# Patient Record
Sex: Female | Born: 1985
Health system: Southern US, Community
[De-identification: ages and names within clinical notes are randomized; demographics above are authoritative.]

## PROBLEM LIST (undated history)

## (undated) DIAGNOSIS — F32A Depression, unspecified: Secondary | ICD-10-CM

## (undated) DIAGNOSIS — F329 Major depressive disorder, single episode, unspecified: Secondary | ICD-10-CM

## (undated) DIAGNOSIS — K259 Gastric ulcer, unspecified as acute or chronic, without hemorrhage or perforation: Secondary | ICD-10-CM

## (undated) HISTORY — PX: APPENDECTOMY: SHX54

---

## 2009-01-04 ENCOUNTER — Emergency Department (HOSPITAL_COMMUNITY): Admission: EM | Admit: 2009-01-04 | Discharge: 2009-01-04 | Payer: Self-pay | Admitting: Emergency Medicine

## 2009-02-06 ENCOUNTER — Emergency Department (HOSPITAL_COMMUNITY): Admission: EM | Admit: 2009-02-06 | Discharge: 2009-02-06 | Payer: Self-pay | Admitting: Emergency Medicine

## 2011-07-01 ENCOUNTER — Emergency Department (HOSPITAL_COMMUNITY)
Admission: EM | Admit: 2011-07-01 | Discharge: 2011-07-01 | Disposition: A | Discharge status: Home / Self Care | Payer: Self-pay | Source: Home / Self Care | Attending: Family Medicine | Admitting: Family Medicine

## 2011-07-02 DIAGNOSIS — N83209 Unspecified ovarian cyst, unspecified side: Secondary | ICD-10-CM | POA: Insufficient documentation

## 2011-07-02 DIAGNOSIS — Z9189 Other specified personal risk factors, not elsewhere classified: Secondary | ICD-10-CM | POA: Insufficient documentation

## 2011-10-31 ENCOUNTER — Emergency Department (HOSPITAL_BASED_OUTPATIENT_CLINIC_OR_DEPARTMENT_OTHER): Payer: Worker's Compensation

## 2011-10-31 ENCOUNTER — Emergency Department (HOSPITAL_BASED_OUTPATIENT_CLINIC_OR_DEPARTMENT_OTHER)
Admission: EM | Admit: 2011-10-31 | Discharge: 2011-10-31 | Disposition: A | Discharge status: Home / Self Care | Payer: Worker's Compensation | Attending: Emergency Medicine | Admitting: Emergency Medicine

## 2011-10-31 ENCOUNTER — Encounter (HOSPITAL_BASED_OUTPATIENT_CLINIC_OR_DEPARTMENT_OTHER): Payer: Self-pay

## 2011-10-31 DIAGNOSIS — R110 Nausea: Secondary | ICD-10-CM | POA: Insufficient documentation

## 2011-10-31 DIAGNOSIS — IMO0002 Reserved for concepts with insufficient information to code with codable children: Secondary | ICD-10-CM | POA: Insufficient documentation

## 2011-10-31 DIAGNOSIS — R51 Headache: Secondary | ICD-10-CM

## 2011-10-31 MED ORDER — IBUPROFEN 800 MG PO TABS: 800.0000 mg | ORAL_TABLET | Freq: Three times a day (TID) | ORAL | Status: AC

## 2011-10-31 MED ORDER — HYDROCODONE-ACETAMINOPHEN 5-325 MG PO TABS
2.0000 | ORAL_TABLET | Freq: Once | ORAL | Status: AC
  Administered 2011-10-31: 2 via ORAL
  Filled 2011-10-31: qty 2

## 2011-10-31 MED ORDER — HYDROCODONE-ACETAMINOPHEN 5-325 MG PO TABS: 2.0000 | ORAL_TABLET | ORAL | Status: AC | PRN

## 2011-10-31 NOTE — ED Notes (Signed)
NP at bedside.

## 2011-10-31 NOTE — Discharge Instructions (Signed)

## 2011-10-31 NOTE — ED Notes (Signed)
Pt was seen by Urgent Care PTA.

## 2011-10-31 NOTE — ED Notes (Signed)
Pt reports striking her head on a "bed" 10/20/11, seen by PM and had a negative CT scan.  She continues to have a headache.

## 2011-10-31 NOTE — ED Provider Notes (Signed)
Medical screening examination/treatment/procedure(s) were performed by non-physician practitioner and as supervising physician I was immediately available for consultation/collaboration.  Geoffery Lyons, MD 10/31/11 2049

## 2011-10-31 NOTE — ED Provider Notes (Signed)
History     CSN: [id number]  Arrival date & time 10/31/11  1359   First MD Initiated Contact with Patient 10/31/11 1456      Chief Complaint  Patient presents with  . Head Injury    (Consider location/radiation/quality/duration/timing/severity/associated sxs/prior treatment) Patient is a 26 y.o. female presenting with headaches. The history is provided by the patient. No language interpreter was used.  Headache  This is a recurrent problem. The problem occurs constantly. The problem has been gradually worsening. The pain is located in the occipital region. The quality of the pain is described as sharp and throbbing. The pain is severe. Associated symptoms include nausea. She has tried oral narcotic analgesics for the symptoms. The treatment provided moderate relief.  Pt complains of hitting herre head 11 days ago.  Pt complains of having a severe headache since.   Pt reports relief with hydrocodone.   Pt complains of visual blurring of right eye.   Pt hit the back of her head  History reviewed. No pertinent past medical history.  Past Surgical History  Procedure Date  . Appendectomy     No family history on file.  History  Substance Use Topics  . Smoking status: Never Smoker   . Smokeless tobacco: Not on file  . Alcohol Use: No    OB History    Grav Para Term Preterm Abortions TAB SAB Ect Mult Living                  Review of Systems  Gastrointestinal: Positive for nausea.  Neurological: Positive for headaches.  All other systems reviewed and are negative.    Allergies  Review of patient's allergies indicates no known allergies.  Home Medications   Current Outpatient Rx  Name Route Sig Dispense Refill  . HYDROCODONE-ACETAMINOPHEN 5-325 MG PO TABS Oral Take 1 tablet by mouth every 6 (six) hours as needed.      BP 115/63  Pulse 53  Temp(Src) 98.9 F (37.2 C) (Oral)  Resp 16  Ht 5\' 6"  (1.676 m)  Wt 110 lb (49.896 kg)  BMI 17.75 kg/m2  SpO2 100%  LMP  10/31/2011  Physical Exam  Nursing note and vitals reviewed. Constitutional: She appears well-developed and well-nourished.  HENT:  Head: Normocephalic and atraumatic.  Right Ear: External ear normal.  Left Ear: External ear normal.  Nose: Nose normal.  Mouth/Throat: Oropharynx is clear and moist.  Eyes: Conjunctivae and EOM are normal. Pupils are equal, round, and reactive to light.  Neck: Normal range of motion. Neck supple.  Cardiovascular: Normal rate and regular rhythm.   Pulmonary/Chest: Effort normal and breath sounds normal.  Abdominal: Soft. Bowel sounds are normal.  Musculoskeletal: Normal range of motion.  Neurological: She is alert.  Skin: Skin is warm.  Psychiatric: She has a normal mood and affect.    ED Course  Procedures (including critical care time)  Labs Reviewed - No data to display No results found.   1. Headache       MDM  I repeated Ct scan due to visual changes.   I will give hydrodone along with ibuprofen.  Pt is advised to decrease use of narcotic for headche.   Pt referred to [facility] neurology for evaluation.        Lonia Skinner [place], [place] 10/31/11 1710

## 2011-11-11 ENCOUNTER — Telehealth: Payer: Self-pay | Admitting: Emergency Medicine

## 2012-04-08 ENCOUNTER — Encounter (HOSPITAL_COMMUNITY): Payer: Self-pay | Admitting: *Deleted

## 2012-04-08 DIAGNOSIS — R51 Headache: Secondary | ICD-10-CM | POA: Insufficient documentation

## 2012-04-08 NOTE — ED Notes (Signed)
Pt c/o HA x 2 months, began when she fell and hit her head on steel post.  Pain in back of head and temporal lobes bilaterally.  Photophobia and dizziness.  Denies n/v, CP, SOB

## 2012-04-08 NOTE — ED Notes (Signed)
Patient's family member asking about wait time; updated patient and family member about wait time and apologized about delay.  Patient denies any changes in condition at this time; will continue to monitor.

## 2012-04-09 ENCOUNTER — Emergency Department (HOSPITAL_COMMUNITY)
Admission: EM | Admit: 2012-04-09 | Discharge: 2012-04-09 | Disposition: A | Discharge status: Home / Self Care | Payer: Worker's Compensation | Attending: Emergency Medicine | Admitting: Emergency Medicine

## 2012-04-09 DIAGNOSIS — R51 Headache: Secondary | ICD-10-CM

## 2012-04-09 MED ORDER — BUTALBITAL-APAP-CAFFEINE 50-325-40 MG PO TABS: 1.0000 | ORAL_TABLET | Freq: Three times a day (TID) | ORAL | Status: AC | PRN

## 2012-04-09 MED ORDER — LORAZEPAM 2 MG/ML IJ SOLN
1.0000 mg | Freq: Once | INTRAMUSCULAR | Status: AC
  Administered 2012-04-09: 1 mg via INTRAVENOUS
  Filled 2012-04-09: qty 1

## 2012-04-09 MED ORDER — METOCLOPRAMIDE HCL 5 MG/ML IJ SOLN
10.0000 mg | Freq: Once | INTRAMUSCULAR | Status: AC
  Administered 2012-04-09: 10 mg via INTRAVENOUS
  Filled 2012-04-09: qty 2

## 2012-04-09 MED ORDER — DIPHENHYDRAMINE HCL 50 MG/ML IJ SOLN
25.0000 mg | Freq: Once | INTRAMUSCULAR | Status: AC
  Administered 2012-04-09: 25 mg via INTRAVENOUS
  Filled 2012-04-09: qty 1

## 2012-04-09 NOTE — ED Notes (Signed)
MD Otter at bedside. 

## 2012-04-09 NOTE — ED Provider Notes (Signed)
History     CSN: [id number]  Arrival date & time 04/08/12  2245   First MD Initiated Contact with Patient 04/09/12 0108      Chief Complaint  Patient presents with  . Headache    (Consider location/radiation/quality/duration/timing/severity/associated sxs/prior treatment) HPI 26 year old female presents to emergency department with complaint of ongoing headache for the last 6 months after head injury. 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 Patient had brief LOC and headache after the head injury. She's had head CT done in our system which showed no intercranial injury. She was referred to neurology at that time, when seen in May. She has not yet made the appointment. She's been seen at a local urgent care she estimates over 10 times. She has been referred to physical therapy, which has helped with her symptoms. She was recommended an MRI which she has not had done yet.  She is also has been prescribed hydrocodone for her pain. Patient ran out of her hydrocodone about a week ago. She reports daily headaches, throbbing in nature at the base of her head extending up over her head bilaterally. These headaches are daily since the accident.She has photophobia and nausea with her headaches. She has been unable to work due to the pain.  No prior history of migraines. She has an appointment pending with the neurologist next month.  History reviewed. No pertinent past medical history.  Past Surgical History  Procedure Date  . Appendectomy     History reviewed. No pertinent family history.  History  Substance Use Topics  . Smoking status: Never Smoker   . Smokeless tobacco: Not on file  . Alcohol Use: No    OB History    Grav Para Term Preterm Abortions TAB SAB Ect Mult Living                  Review of Systems  Unable to perform ROS due to language barrier, significant pain  Allergies  Review of patient's allergies indicates no known  allergies.  Home Medications  No current outpatient prescriptions on file.  BP 143/94  Pulse 64  Temp 97.8 F (36.6 C) (Oral)  Resp 20  SpO2 100%  Physical Exam  Nursing note and vitals reviewed. Constitutional: She is oriented to person, place, and time. She appears well-developed and well-nourished. She appears distressed.       tearful  HENT:  Head: Normocephalic and atraumatic.  Nose: Nose normal.  Mouth/Throat: Oropharynx is clear and moist.       Bilateral ears blocked with cerumen  Eyes: Conjunctivae normal and EOM are normal. Pupils are equal, round, and reactive to light.  Neck: Normal range of motion. Neck supple. No JVD present. No tracheal deviation present. No thyromegaly present.  Cardiovascular: Normal rate, regular rhythm, normal heart sounds and intact distal pulses.  Exam reveals no gallop and no friction rub.   No murmur heard. Pulmonary/Chest: Effort normal and breath sounds normal. No stridor. No respiratory distress. She has no wheezes. She has no rales. She exhibits no tenderness.  Abdominal: Soft. Bowel sounds are normal. She exhibits no distension and no mass. There is no tenderness. There is no rebound and no guarding.  Musculoskeletal: Normal range of motion. She exhibits no edema and no tenderness.  Lymphadenopathy:    She has no cervical adenopathy.  Neurological: She is alert and oriented to person, place, and time. She has normal reflexes. No cranial nerve deficit. She exhibits normal  muscle tone. Coordination normal.  Skin: Skin is warm and dry. No rash noted. No erythema. No pallor.  Psychiatric: Her behavior is normal. Judgment and thought content normal.       Tearful, flat affect, upset    ED Course  Procedures (including critical care time)  Labs Reviewed - No data to display No results found.   1. Headache       MDM  26 year old female with ongoing headaches since head trauma over 7 months ago. CT scan reviewed no acute findings.  There may be some rebound headache going on given her recent stopping of hydrocodone. We'll give headache cocktail, strongly encouraged her to followup with neurology as planned, and get MRI as suggested.  4:24 AM Headache improved, pt ready to go home.        Olivia Mackie, MD 04/09/12 [phone]

## 2012-11-05 ENCOUNTER — Ambulatory Visit (INDEPENDENT_AMBULATORY_CARE_PROVIDER_SITE_OTHER): Payer: PRIVATE HEALTH INSURANCE | Admitting: Physician Assistant

## 2012-11-05 VITALS — BP 100/58 | HR 54 | Temp 97.7°F | Resp 16 | Ht 66.0 in | Wt 106.0 lb

## 2012-11-05 DIAGNOSIS — N912 Amenorrhea, unspecified: Secondary | ICD-10-CM

## 2012-11-05 DIAGNOSIS — L659 Nonscarring hair loss, unspecified: Secondary | ICD-10-CM

## 2012-11-05 DIAGNOSIS — R5383 Other fatigue: Secondary | ICD-10-CM

## 2012-11-05 DIAGNOSIS — R5381 Other malaise: Secondary | ICD-10-CM

## 2012-11-05 LAB — COMPREHENSIVE METABOLIC PANEL
ALT: 9 U/L (ref 0–35)
AST: 17 U/L (ref 0–37)
Albumin: 4.9 g/dL (ref 3.5–5.2)
Alkaline Phosphatase: 42 U/L (ref 39–117)
BUN: 10 mg/dL (ref 6–23)
CO2: 26 mEq/L (ref 19–32)
Calcium: 9.9 mg/dL (ref 8.4–10.5)
Chloride: 102 mEq/L (ref 96–112)
Creat: 0.7 mg/dL (ref 0.50–1.10)
Glucose, Bld: 84 mg/dL (ref 70–99)
Potassium: 4.5 mEq/L (ref 3.5–5.3)
Sodium: 135 mEq/L (ref 135–145)
Total Bilirubin: 0.6 mg/dL (ref 0.3–1.2)
Total Protein: 8.2 g/dL (ref 6.0–8.3)

## 2012-11-05 LAB — POCT CBC
Granulocyte percent: 67.6 %G (ref 37–80)
HCT, POC: 43.2 % (ref 37.7–47.9)
Hemoglobin: 13.7 g/dL (ref 12.2–16.2)
Lymph, poc: 2.2 (ref 0.6–3.4)
MCH, POC: 30.8 pg (ref 27–31.2)
MCHC: 31.7 g/dL — AB (ref 31.8–35.4)
MCV: 97 fL (ref 80–97)
MID (cbc): 0.4 (ref 0–0.9)
MPV: 9 fL (ref 0–99.8)
POC Granulocyte: 5.5 (ref 2–6.9)
POC LYMPH PERCENT: 27.1 %L (ref 10–50)
POC MID %: 5.3 %M (ref 0–12)
Platelet Count, POC: 360 10*3/uL (ref 142–424)
RBC: 4.45 M/uL (ref 4.04–5.48)
RDW, POC: 12.9 %
WBC: 8.2 10*3/uL (ref 4.6–10.2)

## 2012-11-05 LAB — POCT URINE PREGNANCY: Preg Test, Ur: NEGATIVE

## 2012-11-05 NOTE — Progress Notes (Signed)
Subjective:    Patient ID: Alyssa Kaiser, female    DOB: 16-Apr-1986, 27 y.o.   MRN: [id number]  HPI 27 year old female presents for evaluation of menstrual irregularities.   Admits she has not had a menses in 2 months. Does have a history of irregular cycles over the last 2 years.  Has had 4 negative uHCG's at home.  She does present today with her fiance who is translating/speaking for her.  Also requests thyroid screening today. He is concerned that this could be the cause of her symptoms.  She did have a normal thyroid screen 2 years ago.  Admits to a positive family history of thyroid disease (unsure if hyper- or hypo-).  Also complains of thinning hair and cold intolerance.  No significant weight gain or weight loss.  Fiance admits that their diet is "all healthy food" but that she does have a very difficult time gaining weight. Hx of headaches treated with OTC midrin and occasional Fioricet use.  Does have a PCP (can't remember the name) who she sees for her headaches as well as management of "stress."  States in the past she has been told that her hair thinning was due to stress, however she reports that her anxiety has improved but hair changes remain the same.   Does have a history of benign ovarian cysts.  Otherwise healthy with no other concerns today.     Review of Systems  Constitutional: Positive for fatigue. Negative for fever, chills, activity change and unexpected weight change.  Gastrointestinal: Negative for nausea, vomiting and abdominal pain.  Endocrine: Positive for cold intolerance.  Genitourinary: Negative for dysuria, frequency, vaginal bleeding, vaginal discharge and vaginal pain.       Objective:   Physical Exam  Constitutional: She is oriented to person, place, and time. She appears well-developed and well-nourished.  HENT:  Head: Normocephalic and atraumatic.  Right Ear: Hearing, tympanic membrane, external ear and ear canal normal.  Left Ear: Hearing, tympanic  membrane, external ear and ear canal normal.  Mouth/Throat: Uvula is midline, oropharynx is clear and moist and mucous membranes are normal.  Eyes: Conjunctivae and EOM are normal. Pupils are equal, round, and reactive to light.  Neck: Normal range of motion. Neck supple. Thyromegaly present.  Cardiovascular: Normal rate, regular rhythm and normal heart sounds.   Pulmonary/Chest: Effort normal and breath sounds normal.  Abdominal: Soft. Bowel sounds are normal. There is no tenderness. There is no rebound and no guarding.  Lymphadenopathy:    She has no cervical adenopathy.  Neurological: She is alert and oriented to person, place, and time.  Psychiatric: She has a normal mood and affect. Her behavior is normal. Judgment and thought content normal.    Results for orders placed in visit on 11/05/12  POCT URINE PREGNANCY      Result Value Range   Preg Test, Ur Negative    POCT CBC      Result Value Range   WBC 8.2  4.6 - 10.2 K/uL   Lymph, poc 2.2  0.6 - 3.4   POC LYMPH PERCENT 27.1  10 - 50 %L   MID (cbc) 0.4  0 - 0.9   POC MID % 5.3  0 - 12 %M   POC Granulocyte 5.5  2 - 6.9   Granulocyte percent 67.6  37 - 80 %G   RBC 4.45  4.04 - 5.48 M/uL   Hemoglobin 13.7  12.2 - 16.2 g/dL   HCT, POC 40.9  81.1 -  47.9 %   MCV 97.0  80 - 97 fL   MCH, POC 30.8  27 - 31.2 pg   MCHC 31.7 (*) 31.8 - 35.4 g/dL   RDW, POC 16.1     Platelet Count, POC 360  142 - 424 K/uL   MPV 9.0  0 - 99.8 fL         Assessment & Plan:  Amenorrhea - Plan: POCT urine pregnancy, POCT CBC, Comprehensive metabolic panel, Thyroid Panel With TSH, FSH/LH, Prolactin  Hair thinning - Plan: Thyroid Panel With TSH  Other malaise and fatigue - Plan: POCT CBC  Labs pending.  If all WNL, recommend referral to GYN for further evaluation and treatment Follow up as needed.

## 2012-11-06 LAB — THYROID PANEL WITH TSH
Free Thyroxine Index: 2.6 (ref 1.0–3.9)
T3 Uptake: 28.4 % (ref 22.5–37.0)
T4, Total: 9 ug/dL (ref 5.0–12.5)
TSH: 2.307 u[IU]/mL (ref 0.350–4.500)

## 2012-11-06 LAB — PROLACTIN: Prolactin: 19.7 ng/mL

## 2012-11-06 LAB — FSH/LH
FSH: 3.5 m[IU]/mL
LH: 8.2 m[IU]/mL

## 2012-11-08 ENCOUNTER — Telehealth: Payer: Self-pay | Admitting: Radiology

## 2012-11-08 DIAGNOSIS — N926 Irregular menstruation, unspecified: Secondary | ICD-10-CM

## 2012-11-08 NOTE — Telephone Encounter (Signed)
Patient advised labs normal referral made as indicated by Alyssa Kaiser

## 2012-12-22 ENCOUNTER — Encounter: Payer: Self-pay | Admitting: Obstetrics and Gynecology

## 2013-04-18 ENCOUNTER — Emergency Department: Payer: Self-pay | Admitting: Emergency Medicine

## 2013-04-18 LAB — CBC WITH DIFFERENTIAL/PLATELET
Eosinophil %: 0.7 %
MCH: 31.2 pg (ref 26.0–34.0)
MCHC: 34.4 g/dL (ref 32.0–36.0)
Monocyte #: 0.6 x10 3/mm (ref 0.2–0.9)
Neutrophil %: 74.9 %
Platelet: 291 10*3/uL (ref 150–440)

## 2013-04-18 LAB — URINALYSIS, COMPLETE
Bacteria: NONE SEEN
Bilirubin,UR: NEGATIVE
Ketone: NEGATIVE
Nitrite: NEGATIVE
Ph: 8 (ref 4.5–8.0)
Protein: NEGATIVE
Specific Gravity: 1.005 (ref 1.003–1.030)
WBC UR: 1 /HPF (ref 0–5)

## 2013-04-18 LAB — BASIC METABOLIC PANEL
Anion Gap: 2 — ABNORMAL LOW (ref 7–16)
BUN: 12 mg/dL (ref 7–18)
Calcium, Total: 9.8 mg/dL (ref 8.5–10.1)
Chloride: 107 mmol/L (ref 98–107)
EGFR (Non-African Amer.): >60
Sodium: 138 mmol/L (ref 136–145)

## 2013-04-18 LAB — ETHANOL
Ethanol %: <0.003 % (ref 0.000–0.080)
Ethanol: <3 mg/dL

## 2013-04-18 LAB — PROTIME-INR: INR: 1

## 2013-09-28 ENCOUNTER — Ambulatory Visit: Payer: Self-pay | Admitting: Family Medicine

## 2013-09-28 VITALS — BP 100/66 | HR 63 | Temp 98.2°F | Resp 18 | Wt 105.0 lb

## 2013-09-28 DIAGNOSIS — Z3200 Encounter for pregnancy test, result unknown: Secondary | ICD-10-CM

## 2013-09-28 DIAGNOSIS — N912 Amenorrhea, unspecified: Secondary | ICD-10-CM

## 2013-09-28 DIAGNOSIS — Z3202 Encounter for pregnancy test, result negative: Secondary | ICD-10-CM

## 2013-09-28 LAB — POCT URINE PREGNANCY
PREG TEST UR: NEGATIVE
Preg Test, Ur: NEGATIVE

## 2013-09-28 LAB — HCG, QUANTITATIVE, PREGNANCY

## 2013-09-28 NOTE — Progress Notes (Signed)
Subjective: 28 year old lady who is late for her menstrual cycle. Her last period was March 2. She did 2 pregnancy test at home last night and they were positive. She is coming here for verification.  She is living with her fianc, both of them students. They have been sexually involved for about 4 months she says they are "culturally married", are from [place] refugees. Otherwise she is healthy. No symptoms of nausea or breast tenderness  Objective: Abdomen soft without masses or tenderness Results for orders placed in visit on 09/28/13  POCT URINE PREGNANCY      Result Value Ref Range   Preg Test, Ur Negative     Assessment: Amenorrhea  Plan: Urine test was repeated and it is definitely negative here. We will draw a qualitative hCG.   discussed some options with her. If no period come back

## 2013-09-28 NOTE — Patient Instructions (Signed)
If you still have no menstrual cycle in the next month, but are not pregnant, please return.  If you are pregnant, you should make arrangements to see an OB/GYN doctor. You may need to go to the [facility] to get into the system if necessary since she did not have insurance.

## 2013-09-29 ENCOUNTER — Telehealth: Payer: Self-pay

## 2013-09-29 NOTE — Telephone Encounter (Signed)
Pt calling about labs.  [phone]

## 2013-09-30 NOTE — Telephone Encounter (Signed)
Heather reviewed and pt picked up a copy

## 2013-10-02 NOTE — Telephone Encounter (Signed)
Thanks

## 2014-02-05 ENCOUNTER — Encounter (HOSPITAL_COMMUNITY): Payer: Self-pay | Admitting: Emergency Medicine

## 2014-02-05 ENCOUNTER — Emergency Department (INDEPENDENT_AMBULATORY_CARE_PROVIDER_SITE_OTHER)
Admission: EM | Admit: 2014-02-05 | Discharge: 2014-02-05 | Disposition: A | Discharge status: Home / Self Care | Payer: Self-pay | Source: Home / Self Care | Attending: Family Medicine | Admitting: Family Medicine

## 2014-02-05 DIAGNOSIS — B354 Tinea corporis: Secondary | ICD-10-CM

## 2014-02-05 DIAGNOSIS — L659 Nonscarring hair loss, unspecified: Secondary | ICD-10-CM

## 2014-02-05 DIAGNOSIS — L658 Other specified nonscarring hair loss: Secondary | ICD-10-CM

## 2014-02-05 HISTORY — DX: Major depressive disorder, single episode, unspecified: F32.9

## 2014-02-05 HISTORY — DX: Depression, unspecified: F32.A

## 2014-02-05 LAB — TSH: TSH: 1.87 u[IU]/mL (ref 0.350–4.500)

## 2014-02-05 MED ORDER — TERBINAFINE HCL 250 MG PO TABS: 250.0000 mg | ORAL_TABLET | Freq: Every day | ORAL | Status: DC

## 2014-02-05 MED ORDER — MINOXIDIL 5 % EX FOAM: CUTANEOUS | Status: DC

## 2014-02-05 NOTE — Discharge Instructions (Signed)
Thank you for coming in today. Try Rogaine before you do expensive injection therapy. You can present the results of the TSH test your self from home use any my chart app. Take terbinafine daily for one week   Body Ringworm Ringworm (tinea corporis) is a fungal infection of the skin on the body. This infection is not caused by worms, but is actually caused by a fungus. Fungus normally lives on the top of your skin and can be useful. However, in the case of ringworms, the fungus grows out of control and causes a skin infection. It can involve any area of skin on the body and can spread easily from one person to another (contagious). Ringworm is a common problem for children, but it can affect adults as well. Ringworm is also often found in athletes, especially wrestlers who share equipment and mats.  CAUSES  Ringworm of the body is caused by a fungus called dermatophyte. It can spread by:  Touchingother people who are infected.  Touchinginfected pets.  Touching or sharingobjects that have been in contact with the infected person or pet (hats, combs, towels, clothing, sports equipment). SYMPTOMS   Itchy, raised red spots and bumps on the skin.  Ring-shaped rash.  Redness near the border of the rash with a clear center.  Dry and scaly skin on or around the rash. Not every person develops a ring-shaped rash. Some develop only the red, scaly patches. DIAGNOSIS  Most often, ringworm can be diagnosed by performing a skin exam. Your caregiver may choose to take a skin scraping from the affected area. The sample will be examined under the microscope to see if the fungus is present.  TREATMENT  Body ringworm may be treated with a topical antifungal cream or ointment. Sometimes, an antifungal shampoo that can be used on your body is prescribed. You may be prescribed antifungal medicines to take by mouth if your ringworm is severe, keeps coming back, or lasts a long time.  HOME CARE INSTRUCTIONS    Only take over-the-counter or prescription medicines as directed by your caregiver.  Wash the infected area and dry it completely before applying yourcream or ointment.  When using antifungal shampoo to treat the ringworm, leave the shampoo on the body for 3-5 minutes before rinsing.   Wear loose clothing to stop clothes from rubbing and irritating the rash.  Wash or change your bed sheets every night while you have the rash.  Have your pet treated by your veterinarian if it has the same infection. To prevent ringworm:   Practice good hygiene.  Wear sandals or shoes in public places and showers.  Do not share personal items with others.  Avoid touching red patches of skin on other people.  Avoid touching pets that have bald spots or wash your hands after doing so. SEEK MEDICAL CARE IF:   Your rash continues to spread after 7 days of treatment.  Your rash is not gone in 4 weeks.  The area around your rash becomes red, warm, tender, and swollen. Document Released: 05/16/2000 Document Revised: 02/11/2012 Document Reviewed: 12/01/2011 [facility] Patient Information 2015 [place], [place]. This information is not intended to replace advice given to you by your health care provider. Make sure you discuss any questions you have with your health care provider.

## 2014-02-05 NOTE — ED Provider Notes (Signed)
Alyssa Kaiser is a 28 y.o. female who presents to Urgent Care today for hair loss. Patient has a one-year history of thinning hair. She denies any specific bald spots. She notes multiple female family members have baldness and several female family members have thinning hair. She has not had any treatment yet. She is considering some sort of cosmetic blood injection therapy and requests a thyroid panel before this treatment can be performed.   Additionally the patient notes a scaly patch on her right upper arm. This is itchy and has been present now for several weeks. She's tried some over-the-counter cream for one week which did not help much.     Past Medical History  Diagnosis Date  . Depression    History  Substance Use Topics  . Smoking status: Never Smoker   . Smokeless tobacco: Not on file  . Alcohol Use: No   ROS as above Medications: No current facility-administered medications for this encounter.   Current Outpatient Prescriptions  Medication Sig Dispense Refill  . Multiple Vitamin (MULTIVITAMIN) tablet Take 1 tablet by mouth daily.      . Minoxidil (ROGAINE WOMENS) 5 % FOAM Apply to scalp daily  60 g  1  . terbinafine (LAMISIL) 250 MG tablet Take 1 tablet (250 mg total) by mouth daily.  7 tablet  0    Exam:  BP 113/71  Pulse 64  Temp(Src) 98.7 F (37.1 C) (Oral)  Resp 14  SpO2 100%  LMP 01/21/2014 Gen: Well NAD HEENT: EOMI,  MMM Lungs: Normal work of breathing. CTABL Heart: RRR no MRG Abd: NABS, Soft. Nondistended, Nontender Exts: Brisk capillary refill, warm and well perfused.   scalp: No focal area of hair loss. Feeding pattern over the crown. The hair is not fragile. No scarring visible. No erythema. Skin: Scaly erythematous annular lesion right upper arm with central clearing  No results found for this or any previous visit (from the past 24 hour(s)). No results found.  Assessment and Plan: 28 y.o. female with  1) hair loss : Likely female pattern hair  loss . Recommend over-the-counter Rogaine trial before injectable cosmetic procedure. TSH pending.   2) rash: Likely tinea corporis. Trial of oral Lamisil as topical cream was not very effective.  Discussed warning signs or symptoms. Please see discharge instructions. Patient expresses understanding.   This note was created using Systems analyst. Any transcription errors are unintended.    Gregor Hams, MD 02/05/14 1329

## 2014-02-05 NOTE — ED Notes (Signed)
Patient reports a year long history of hair falling out.  Patient requesting blood work to r/o sources.  Patient also has a circular rash to right upper arm.

## 2014-02-05 NOTE — ED Notes (Signed)
TSH 1.870.  Message to Dr. Georgina Snell. Alyssa Kaiser 02/05/2014

## 2014-02-07 ENCOUNTER — Telehealth (HOSPITAL_COMMUNITY): Payer: Self-pay | Admitting: *Deleted

## 2014-03-06 ENCOUNTER — Inpatient Hospital Stay (HOSPITAL_COMMUNITY): Payer: Self-pay

## 2014-03-06 ENCOUNTER — Inpatient Hospital Stay (HOSPITAL_COMMUNITY)
Admission: AD | Admit: 2014-03-06 | Discharge: 2014-03-06 | Disposition: A | Discharge status: Home / Self Care | Payer: Self-pay | Source: Ambulatory Visit | Attending: Obstetrics & Gynecology | Admitting: Obstetrics & Gynecology

## 2014-03-06 ENCOUNTER — Encounter (HOSPITAL_COMMUNITY): Payer: Self-pay | Admitting: *Deleted

## 2014-03-06 DIAGNOSIS — O9989 Other specified diseases and conditions complicating pregnancy, childbirth and the puerperium: Secondary | ICD-10-CM

## 2014-03-06 DIAGNOSIS — D259 Leiomyoma of uterus, unspecified: Secondary | ICD-10-CM | POA: Insufficient documentation

## 2014-03-06 DIAGNOSIS — R102 Pelvic and perineal pain: Secondary | ICD-10-CM

## 2014-03-06 DIAGNOSIS — M25559 Pain in unspecified hip: Secondary | ICD-10-CM

## 2014-03-06 DIAGNOSIS — N949 Unspecified condition associated with female genital organs and menstrual cycle: Secondary | ICD-10-CM | POA: Insufficient documentation

## 2014-03-06 LAB — URINALYSIS, ROUTINE W REFLEX MICROSCOPIC
Bilirubin Urine: NEGATIVE
Glucose, UA: NEGATIVE mg/dL
Hgb urine dipstick: NEGATIVE
Ketones, ur: NEGATIVE mg/dL
LEUKOCYTES UA: NEGATIVE
Nitrite: NEGATIVE
PROTEIN: NEGATIVE mg/dL
Specific Gravity, Urine: 1.02 (ref 1.005–1.030)
UROBILINOGEN UA: 0.2 mg/dL (ref 0.0–1.0)
pH: 6.5 (ref 5.0–8.0)

## 2014-03-06 LAB — POCT PREGNANCY, URINE: Preg Test, Ur: NEGATIVE

## 2014-03-06 MED ORDER — TRAMADOL HCL 50 MG PO TABS: 50.0000 mg | ORAL_TABLET | Freq: Four times a day (QID) | ORAL | Status: DC | PRN

## 2014-03-06 NOTE — MAU Note (Signed)
Patient presents to MAU with c/o lower abdominal pain that is sharp and cramping in nature; has had for the last 2-3 days. States period is late; was suppose to start on October 1st.

## 2014-03-06 NOTE — [facility] Provider Note (Signed)
History     CSN: [id number]  Arrival date and time: 03/06/14 1245   First Provider Initiated Contact with Patient 03/06/14 1327      Chief Complaint  Patient presents with  . Abdominal Pain   HPI This is a 28 y.o. female who presents with late menses (only a few days late). She states she has some lower abdominal pain that is sharp and crampy. Has been present for several days. No fever,nausea, vomiting, diarrhea or constipation. Already had her appendix taken out. Concerned for pregnancy  RN Note:  Patient presents to [facility]U with c/o lower abdominal pain that is sharp and cramping in nature; has had for the last 2-3 days. States period is late; was suppose to start on October 1st.        OB History   Grav Para Term Preterm Abortions TAB SAB Ect Mult Living                  Past Medical History  Diagnosis Date  . Depression     Past Surgical History  Procedure Laterality Date  . Appendectomy      Family History  Problem Relation Age of Onset  . Thyroid disease Mother     History  Substance Use Topics  . Smoking status: Never Smoker   . Smokeless tobacco: Not on file  . Alcohol Use: No    Allergies: No Known Allergies  Prescriptions prior to admission  Medication Sig Dispense Refill  . Minoxidil (ROGAINE WOMENS) 5 % FOAM Apply to scalp daily  60 g  1  . Multiple Vitamin (MULTIVITAMIN) tablet Take 1 tablet by mouth daily.      Marland Kitchen terbinafine (LAMISIL) 250 MG tablet Take 1 tablet (250 mg total) by mouth daily.  7 tablet  0    Review of Systems  Constitutional: Negative for fever, chills and malaise/fatigue.  Gastrointestinal: Positive for abdominal pain. Negative for nausea, vomiting, diarrhea and constipation.  Genitourinary: Negative for dysuria.  Neurological: Negative for dizziness and weakness.   Physical Exam   Blood pressure 136/71, pulse 57, temperature 97.7 F (36.5 C), temperature source Oral, resp. rate 16, height 5\' 6"  (1.676 m), weight 108 lb  6.4 oz (49.17 kg), last menstrual period 01/31/2014, SpO2 100.00%.  Physical Exam  Constitutional: She is oriented to person, place, and time. She appears well-developed and well-nourished. No distress.  HENT:  Head: Normocephalic.  Cardiovascular: Normal rate.   Respiratory: Effort normal.  GI: Soft. She exhibits no distension. There is no tenderness. There is no rebound and no guarding.  Genitourinary: Vagina normal. No vaginal discharge found.  Uterus small, somewhat tender midline Declines STD testing   Musculoskeletal: Normal range of motion.  Neurological: She is alert and oriented to person, place, and time.  Skin: Skin is warm and dry.  Psychiatric: She has a normal mood and affect.    MAU Course  Procedures  MDM Results for orders placed during the hospital encounter of 03/06/14 (from the past 72 hour(s))  URINALYSIS, ROUTINE W REFLEX MICROSCOPIC     Status: None   Collection Time    03/06/14  1:08 PM      Result Value Ref Range   Color, Urine YELLOW  YELLOW   APPearance CLEAR  CLEAR   Specific Gravity, Urine 1.020  1.005 - 1.030   pH 6.5  5.0 - 8.0   Glucose, UA NEGATIVE  NEGATIVE mg/dL   Hgb urine dipstick NEGATIVE  NEGATIVE   Bilirubin Urine NEGATIVE  NEGATIVE   Ketones, ur NEGATIVE  NEGATIVE mg/dL   Protein, ur NEGATIVE  NEGATIVE mg/dL   Urobilinogen, UA 0.2  0.0 - 1.0 mg/dL   Nitrite NEGATIVE  NEGATIVE   Leukocytes, UA NEGATIVE  NEGATIVE   Comment: MICROSCOPIC NOT DONE ON URINES WITH NEGATIVE PROTEIN, BLOOD, LEUKOCYTES, NITRITE, OR GLUCOSE <1000 mg/dL.  POCT PREGNANCY, URINE     Status: None   Collection Time    03/06/14  1:11 PM      Result Value Ref Range   Preg Test, Ur NEGATIVE  NEGATIVE   Comment:            THE SENSITIVITY OF THIS     METHODOLOGY IS >24 mIU/mL   US Transvaginal Non-ob  03/06/2014   CLINICAL DATA:  Three day history of lower abdominal pain which is sharp in cramping in nature.  EXAM: TRANSABDOMINAL AND TRANSVAGINAL ULTRASOUND OF  PELVIS  TECHNIQUE: Both transabdominal and transvaginal ultrasound examinations of the pelvis were performed. Transabdominal technique was performed for global imaging of the pelvis including uterus, ovaries, adnexal regions, and pelvic cul-de-sac. It was necessary to proceed with endovaginal exam following the transabdominal exam to visualize the ovaries.  COMPARISON:  None  FINDINGS: Uterus  Measurements: 8.6 x 5.2 x 6.6 cm. 1.6 cm submucosal fibroid is identified in the posterior uterine body.  Endometrium  Thickness: 20 mm in double-layer thickness. No focal abnormality visualized.  Right ovary  Measurements: 2.0 x 1.5 x 1.4 cm. Normal appearance/no adnexal mass.  Left ovary  Measurements: 3.6 x 3.5 x 3.5 cm. Normal appearance/no adnexal mass.  Other findings  A small amount of simple appearing intraperitoneal free fluid is evident.  IMPRESSION: 1.6 cm submucosal posterior uterine fibroid.  20 mm endometrial stripe thickness. Endometrial thickness is considered abnormal. Given no history of abnormal uterine bleeding, consider follow-up by [place] in 6-8 weeks, during the week immediately following menses (exam timing is critical).   Electronically Signed   By: Misty Stanley M.D.   On: 03/06/2014 15:48   US Pelvis Complete  03/06/2014   CLINICAL DATA:  Three day history of lower abdominal pain which is sharp in cramping in nature.  EXAM: TRANSABDOMINAL AND TRANSVAGINAL ULTRASOUND OF PELVIS  TECHNIQUE: Both transabdominal and transvaginal ultrasound examinations of the pelvis were performed. Transabdominal technique was performed for global imaging of the pelvis including uterus, ovaries, adnexal regions, and pelvic cul-de-sac. It was necessary to proceed with endovaginal exam following the transabdominal exam to visualize the ovaries.  COMPARISON:  None  FINDINGS: Uterus  Measurements: 8.6 x 5.2 x 6.6 cm. 1.6 cm submucosal fibroid is identified in the posterior uterine body.  Endometrium  Thickness: 20 mm in  double-layer thickness. No focal abnormality visualized.  Right ovary  Measurements: 2.0 x 1.5 x 1.4 cm. Normal appearance/no adnexal mass.  Left ovary  Measurements: 3.6 x 3.5 x 3.5 cm. Normal appearance/no adnexal mass.  Other findings  A small amount of simple appearing intraperitoneal free fluid is evident.  IMPRESSION: 1.6 cm submucosal posterior uterine fibroid.  20 mm endometrial stripe thickness. Endometrial thickness is considered abnormal. Given no history of abnormal uterine bleeding, consider follow-up by [place] in 6-8 weeks, during the week immediately following menses (exam timing is critical).   Electronically Signed   By: Misty Stanley M.D.   On: 03/06/2014 15:48    Assessment and Plan  A:  Pelvic pain, probably dysmenorrhea, suspect period will start soon      One small fibroid, probably does  not account for pain  P:  Discussed findings       Followup with OB/GYN for annual exam       If no period in a week, repeat pregnancy test  [facility] 03/06/2014, 1:48 PM

## 2014-03-06 NOTE — Discharge Instructions (Signed)
Abdominal Pain, Women °Abdominal (stomach, pelvic, or belly) pain can be caused by many things. It is important to tell your doctor: °· The location of the pain. °· Does it come and go or is it present all the time? °· Are there things that start the pain (eating certain foods, exercise)? °· Are there other symptoms associated with the pain (fever, nausea, vomiting, diarrhea)? °All of this is helpful to know when trying to find the cause of the pain. °CAUSES  °· Stomach: virus or bacteria infection, or ulcer. °· Intestine: appendicitis (inflamed appendix), regional ileitis (Crohn's disease), ulcerative colitis (inflamed colon), irritable bowel syndrome, diverticulitis (inflamed diverticulum of the colon), or cancer of the stomach or intestine. °· Gallbladder disease or stones in the gallbladder. °· Kidney disease, kidney stones, or infection. °· Pancreas infection or cancer. °· Fibromyalgia (pain disorder). °· Diseases of the female organs: °¨ Uterus: fibroid (non-cancerous) tumors or infection. °¨ Fallopian tubes: infection or tubal pregnancy. °¨ Ovary: cysts or tumors. °¨ Pelvic adhesions (scar tissue). °¨ Endometriosis (uterus lining tissue growing in the pelvis and on the pelvic organs). °¨ Pelvic congestion syndrome (female organs filling up with blood just before the menstrual period). °¨ Pain with the menstrual period. °¨ Pain with ovulation (producing an egg). °¨ Pain with an IUD (intrauterine device, birth control) in the uterus. °¨ Cancer of the female organs. °· Functional pain (pain not caused by a disease, may improve without treatment). °· Psychological pain. °· Depression. °DIAGNOSIS  °Your doctor will decide the seriousness of your pain by doing an examination. °· Blood tests. °· X-rays. °· Ultrasound. °· CT scan (computed tomography, special type of X-ray). °· MRI (magnetic resonance imaging). °· Cultures, for infection. °· Barium enema (dye inserted in the large intestine, to better view it with  X-rays). °· Colonoscopy (looking in intestine with a lighted tube). °· Laparoscopy (minor surgery, looking in abdomen with a lighted tube). °· Major abdominal exploratory surgery (looking in abdomen with a large incision). °TREATMENT  °The treatment will depend on the cause of the pain.  °· Many cases can be observed and treated at home. °· Over-the-counter medicines recommended by your caregiver. °· Prescription medicine. °· Antibiotics, for infection. °· Birth control pills, for painful periods or for ovulation pain. °· Hormone treatment, for endometriosis. °· Nerve blocking injections. °· Physical therapy. °· Antidepressants. °· Counseling with a psychologist or psychiatrist. °· Minor or major surgery. °HOME CARE INSTRUCTIONS  °· Do not take laxatives, unless directed by your caregiver. °· Take over-the-counter pain medicine only if ordered by your caregiver. Do not take aspirin because it can cause an upset stomach or bleeding. °· Try a clear liquid diet (broth or water) as ordered by your caregiver. Slowly move to a bland diet, as tolerated, if the pain is related to the stomach or intestine. °· Have a thermometer and take your temperature several times a day, and record it. °· Bed rest and sleep, if it helps the pain. °· Avoid sexual intercourse, if it causes pain. °· Avoid stressful situations. °· Keep your follow-up appointments and tests, as your caregiver orders. °· If the pain does not go away with medicine or surgery, you may try: °¨ Acupuncture. °¨ Relaxation exercises (yoga, meditation). °¨ Group therapy. °¨ Counseling. °SEEK MEDICAL CARE IF:  °· You notice certain foods cause stomach pain. °· Your home care treatment is not helping your pain. °· You need stronger pain medicine. °· You want your IUD removed. °· You feel faint or   lightheaded.  You develop nausea and vomiting.  You develop a rash.  You are having side effects or an allergy to your medicine. SEEK IMMEDIATE MEDICAL CARE IF:   Your  pain does not go away or gets worse.  You have a fever.  Your pain is felt only in portions of the abdomen. The right side could possibly be appendicitis. The left lower portion of the abdomen could be colitis or diverticulitis.  You are passing blood in your stools (bright red or black tarry stools, with or without vomiting).  You have blood in your urine.  You develop chills, with or without a fever.  You pass out. MAKE SURE YOU:   Understand these instructions.  Will watch your condition.  Will get help right away if you are not doing well or get worse. Document Released: 03/16/2007 Document Revised: 10/03/2013 Document Reviewed: 04/05/2009 [facility] Patient Information 2015 [place], [place]. This information is not intended to replace advice given to you by your health care provider. Make sure you discuss any questions you have with your health care provider.  Fibroids  You only have one very small one.  Fibroids are lumps (tumors) that can occur any place in a woman's body. These lumps are not cancerous. Fibroids vary in size, weight, and where they grow. HOME CARE  Do not take aspirin.  Write down the number of pads or tampons you use during your period. Tell your doctor. This can help determine the best treatment for you. GET HELP RIGHT AWAY IF:  You have pain in your lower belly (abdomen) that is not helped with medicine.  You have cramps that are not helped with medicine.  You have more bleeding between or during your period.  You feel lightheaded or pass out (faint).  Your lower belly pain gets worse. MAKE SURE YOU:  Understand these instructions.  Will watch your condition.  Will get help right away if you are not doing well or get worse. Document Released: 06/21/2010 Document Revised: 08/11/2011 Document Reviewed: 06/21/2010 [facility] Patient Information 2015 [place], [place]. This information is not intended to replace advice given to you by your health  care provider. Make sure you discuss any questions you have with your health care provider.

## 2014-03-09 NOTE — [facility] Provider Note (Signed)
Endometrial thickeing.  Pt will need f/u as outpatient.  Primary gyn can order f/u sono.  Attestation of Attending Supervision of Advanced Practitioner (CNM/NP): Evaluation and management procedures were performed by the Advanced Practitioner under my supervision and collaboration. I have reviewed the Advanced Practitioner's note and chart, and I agree with the management and plan.  Alexsandro Salek H. 10:29 AM

## 2014-10-19 ENCOUNTER — Emergency Department (HOSPITAL_COMMUNITY): Payer: 59

## 2014-10-19 ENCOUNTER — Emergency Department (HOSPITAL_COMMUNITY)
Admission: EM | Admit: 2014-10-19 | Discharge: 2014-10-20 | Disposition: A | Discharge status: Home / Self Care | Payer: 59 | Attending: Emergency Medicine | Admitting: Emergency Medicine

## 2014-10-19 DIAGNOSIS — Y9389 Activity, other specified: Secondary | ICD-10-CM | POA: Diagnosis not present

## 2014-10-19 DIAGNOSIS — S0101XA Laceration without foreign body of scalp, initial encounter: Secondary | ICD-10-CM

## 2014-10-19 DIAGNOSIS — S50811A Abrasion of right forearm, initial encounter: Secondary | ICD-10-CM | POA: Diagnosis not present

## 2014-10-19 DIAGNOSIS — T07XXXA Unspecified multiple injuries, initial encounter: Secondary | ICD-10-CM

## 2014-10-19 DIAGNOSIS — Y92410 Unspecified street and highway as the place of occurrence of the external cause: Secondary | ICD-10-CM | POA: Diagnosis not present

## 2014-10-19 DIAGNOSIS — Y999 Unspecified external cause status: Secondary | ICD-10-CM | POA: Insufficient documentation

## 2014-10-19 DIAGNOSIS — S0990XA Unspecified injury of head, initial encounter: Secondary | ICD-10-CM | POA: Diagnosis present

## 2014-10-19 DIAGNOSIS — S0003XA Contusion of scalp, initial encounter: Secondary | ICD-10-CM

## 2014-10-19 DIAGNOSIS — S060X9A Concussion with loss of consciousness of unspecified duration, initial encounter: Secondary | ICD-10-CM | POA: Diagnosis not present

## 2014-10-19 DIAGNOSIS — S30810A Abrasion of lower back and pelvis, initial encounter: Secondary | ICD-10-CM | POA: Diagnosis not present

## 2014-10-19 DIAGNOSIS — Z8659 Personal history of other mental and behavioral disorders: Secondary | ICD-10-CM | POA: Insufficient documentation

## 2014-10-19 LAB — CBC WITH DIFFERENTIAL/PLATELET
BASOS ABS: 0 10*3/uL (ref 0.0–0.1)
Basophils Relative: 0 % (ref 0–1)
EOS ABS: 0.2 10*3/uL (ref 0.0–0.7)
EOS PCT: 2 % (ref 0–5)
HCT: 36 % (ref 36.0–46.0)
Hemoglobin: 12.2 g/dL (ref 12.0–15.0)
Lymphocytes Relative: 33 % (ref 12–46)
Lymphs Abs: 3.3 10*3/uL (ref 0.7–4.0)
MCH: 30.8 pg (ref 26.0–34.0)
MCHC: 33.9 g/dL (ref 30.0–36.0)
MCV: 90.9 fL (ref 78.0–100.0)
MONO ABS: 0.8 10*3/uL (ref 0.1–1.0)
Monocytes Relative: 8 % (ref 3–12)
Neutro Abs: 5.9 10*3/uL (ref 1.7–7.7)
Neutrophils Relative %: 58 % (ref 43–77)
Platelets: 267 10*3/uL (ref 150–400)
RBC: 3.96 MIL/uL (ref 3.87–5.11)
RDW: 12.2 % (ref 11.5–15.5)
WBC: 10.2 10*3/uL (ref 4.0–10.5)

## 2014-10-19 LAB — I-STAT BETA HCG BLOOD, ED (MC, WL, AP ONLY): I-stat hCG, quantitative: <5 m[IU]/mL (ref <5)

## 2014-10-19 LAB — I-STAT CHEM 8, ED
BUN: 18 mg/dL (ref 6–20)
CREATININE: 0.7 mg/dL (ref 0.44–1.00)
Calcium, Ion: 1.25 mmol/L — ABNORMAL HIGH (ref 1.12–1.23)
Chloride: 102 mmol/L (ref 101–111)
Glucose, Bld: 109 mg/dL — ABNORMAL HIGH (ref 65–99)
HCT: 39 % (ref 36.0–46.0)
Hemoglobin: 13.3 g/dL (ref 12.0–15.0)
Potassium: 3.6 mmol/L (ref 3.5–5.1)
Sodium: 141 mmol/L (ref 135–145)
TCO2: 23 mmol/L (ref 0–100)

## 2014-10-19 LAB — COMPREHENSIVE METABOLIC PANEL
ALBUMIN: 4.3 g/dL (ref 3.5–5.0)
ALK PHOS: 39 U/L (ref 38–126)
ALT: 22 U/L (ref 14–54)
ANION GAP: 9 (ref 5–15)
AST: 25 U/L (ref 15–41)
BILIRUBIN TOTAL: 0.5 mg/dL (ref 0.3–1.2)
BUN: 16 mg/dL (ref 6–20)
CHLORIDE: 104 mmol/L (ref 101–111)
CO2: 26 mmol/L (ref 22–32)
Calcium: 9.5 mg/dL (ref 8.9–10.3)
Creatinine, Ser: 0.73 mg/dL (ref 0.44–1.00)
GFR calc Af Amer: >60 mL/min (ref >60)
GFR calc non Af Amer: >60 mL/min (ref >60)
GLUCOSE: 111 mg/dL — AB (ref 65–99)
POTASSIUM: 3.5 mmol/L (ref 3.5–5.1)
SODIUM: 139 mmol/L (ref 135–145)
Total Protein: 7.8 g/dL (ref 6.5–8.1)

## 2014-10-19 LAB — I-STAT CG4 LACTIC ACID, ED: Lactic Acid, Venous: 1.92 mmol/L (ref 0.5–2.0)

## 2014-10-19 MED ORDER — IOHEXOL 300 MG/ML  SOLN
100.0000 mL | Freq: Once | INTRAMUSCULAR | Status: AC | PRN
  Administered 2014-10-19: 100 mL via INTRAVENOUS

## 2014-10-19 MED ORDER — ONDANSETRON HCL 4 MG/2ML IJ SOLN
4.0000 mg | Freq: Once | INTRAMUSCULAR | Status: AC
  Administered 2014-10-19: 4 mg via INTRAVENOUS

## 2014-10-19 MED ORDER — TETANUS-DIPHTH-ACELL PERTUSSIS 5-2.5-18.5 LF-MCG/0.5 IM SUSP
0.5000 mL | Freq: Once | INTRAMUSCULAR | Status: AC
  Administered 2014-10-19: 0.5 mL via INTRAMUSCULAR
  Filled 2014-10-19: qty 0.5

## 2014-10-19 MED ORDER — FENTANYL CITRATE (PF) 100 MCG/2ML IJ SOLN
INTRAMUSCULAR | Status: AC
  Filled 2014-10-19: qty 2

## 2014-10-19 MED ORDER — MORPHINE SULFATE 4 MG/ML IJ SOLN
4.0000 mg | Freq: Once | INTRAMUSCULAR | Status: AC
  Administered 2014-10-19: 4 mg via INTRAVENOUS
  Filled 2014-10-19: qty 1

## 2014-10-19 MED ORDER — FENTANYL CITRATE (PF) 100 MCG/2ML IJ SOLN
50.0000 ug | Freq: Once | INTRAMUSCULAR | Status: AC
  Administered 2014-10-19: 50 ug via INTRAVENOUS
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
## 2014-10-20 ENCOUNTER — Encounter (HOSPITAL_COMMUNITY): Payer: Self-pay | Admitting: *Deleted

## 2014-10-20 DIAGNOSIS — S0101XA Laceration without foreign body of scalp, initial encounter: Secondary | ICD-10-CM | POA: Diagnosis not present

## 2014-10-20 LAB — URINALYSIS, ROUTINE W REFLEX MICROSCOPIC
Bilirubin Urine: NEGATIVE
Glucose, UA: NEGATIVE mg/dL
Ketones, ur: NEGATIVE mg/dL
LEUKOCYTES UA: NEGATIVE
NITRITE: NEGATIVE
Protein, ur: NEGATIVE mg/dL
SPECIFIC GRAVITY, URINE: 1.011 (ref 1.005–1.030)
UROBILINOGEN UA: 0.2 mg/dL (ref 0.0–1.0)
pH: 6.5 (ref 5.0–8.0)

## 2014-10-20 LAB — URINE MICROSCOPIC-ADD ON

## 2014-10-20 MED ORDER — ONDANSETRON HCL 4 MG/2ML IJ SOLN
4.0000 mg | Freq: Once | INTRAMUSCULAR | Status: AC
  Administered 2014-10-20: 4 mg via INTRAVENOUS
  Filled 2014-10-20: qty 2

## 2014-10-20 MED ORDER — HYDROCODONE-ACETAMINOPHEN 5-325 MG PO TABS: 1.0000 | ORAL_TABLET | Freq: Four times a day (QID) | ORAL | Status: AC | PRN

## 2014-10-20 MED ORDER — LIDOCAINE-EPINEPHRINE (PF) 2 %-1:200000 IJ SOLN
20.0000 mL | Freq: Once | INTRAMUSCULAR | Status: AC
  Administered 2014-10-20: 20 mL via INTRADERMAL
  Filled 2014-10-20: qty 20

## 2014-10-20 MED ORDER — SODIUM CHLORIDE 0.9 % IV SOLN
Freq: Once | INTRAVENOUS | Status: AC
  Administered 2014-10-19: 23:00:00 via INTRAVENOUS

## 2014-10-20 MED ORDER — HYDROCODONE-ACETAMINOPHEN 5-325 MG PO TABS
1.0000 | ORAL_TABLET | Freq: Once | ORAL | Status: AC
  Administered 2014-10-20: 1 via ORAL
  Filled 2014-10-20: qty 1

## 2014-10-20 NOTE — Discharge Instructions (Signed)
Follow up in 1 week for staple removal.  Return sooner if you have fever, purulent drainage from wound, or any other concerns.
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
## 2014-10-20 NOTE — ED Notes (Signed)
Opened wound to the top of her, bleeding controlled.

## 2014-10-20 NOTE — ED Notes (Signed)
Discharge instructions given along with prescription.  Voiced understanding

## 2014-10-21 ENCOUNTER — Emergency Department (HOSPITAL_COMMUNITY)
Admission: EM | Admit: 2014-10-21 | Discharge: 2014-10-21 | Disposition: A | Discharge status: Home / Self Care | Payer: 59 | Attending: Emergency Medicine | Admitting: Emergency Medicine

## 2014-10-21 ENCOUNTER — Encounter (HOSPITAL_COMMUNITY): Payer: Self-pay | Admitting: *Deleted

## 2014-10-21 DIAGNOSIS — T507X5A Adverse effect of analeptics and opioid receptor antagonists, initial encounter: Secondary | ICD-10-CM | POA: Insufficient documentation

## 2014-10-21 DIAGNOSIS — R42 Dizziness and giddiness: Secondary | ICD-10-CM | POA: Insufficient documentation

## 2014-10-21 DIAGNOSIS — T50905A Adverse effect of unspecified drugs, medicaments and biological substances, initial encounter: Secondary | ICD-10-CM

## 2014-10-21 LAB — URINALYSIS, ROUTINE W REFLEX MICROSCOPIC
BILIRUBIN URINE: NEGATIVE
GLUCOSE, UA: NEGATIVE mg/dL
Hgb urine dipstick: NEGATIVE
Ketones, ur: 15 mg/dL — AB
Leukocytes, UA: NEGATIVE
NITRITE: NEGATIVE
Protein, ur: NEGATIVE mg/dL
Specific Gravity, Urine: 1.009 (ref 1.005–1.030)
Urobilinogen, UA: 0.2 mg/dL (ref 0.0–1.0)
pH: 6.5 (ref 5.0–8.0)

## 2014-10-21 LAB — I-STAT CHEM 8, ED
BUN: 11 mg/dL (ref 6–20)
Calcium, Ion: 1.21 mmol/L (ref 1.12–1.23)
Chloride: 105 mmol/L (ref 101–111)
Creatinine, Ser: 0.7 mg/dL (ref 0.44–1.00)
Glucose, Bld: 92 mg/dL (ref 65–99)
HCT: 34 % — ABNORMAL LOW (ref 36.0–46.0)
Hemoglobin: 11.6 g/dL — ABNORMAL LOW (ref 12.0–15.0)
POTASSIUM: 3.6 mmol/L (ref 3.5–5.1)
SODIUM: 139 mmol/L (ref 135–145)
TCO2: 21 mmol/L (ref 0–100)

## 2014-10-21 LAB — I-STAT BETA HCG BLOOD, ED (MC, WL, AP ONLY)

## 2014-10-21 MED ORDER — ONDANSETRON HCL 4 MG/2ML IJ SOLN
4.0000 mg | Freq: Once | INTRAMUSCULAR | Status: AC
  Administered 2014-10-21: 4 mg via INTRAVENOUS
  Filled 2014-10-21: qty 2

## 2014-10-21 MED ORDER — SODIUM CHLORIDE 0.9 % IV BOLUS (SEPSIS)
2000.0000 mL | Freq: Once | INTRAVENOUS | Status: AC
  Administered 2014-10-21: 2000 mL via INTRAVENOUS

## 2014-10-21 MED ORDER — SODIUM CHLORIDE 0.9 % IV SOLN: INTRAVENOUS | Status: DC
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
## 2014-10-21 NOTE — ED Notes (Addendum)
Pt was advised not to take pain medication unless she eats and to increase oral intake. They verbalize understanding.

## 2014-10-21 NOTE — ED Notes (Signed)
Bed: QV95 Expected date:  Expected time:  Means of arrival:  Comments: EMS Syncope

## 2014-10-21 NOTE — ED Notes (Signed)
Upon standing pt feels faint, her eyes began to roll back in her head.  Pt only able to stand with 2 to 3 ppl for supported assist.
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
## 2014-10-21 NOTE — ED Notes (Signed)
MD at bedside. 

## 2014-10-21 NOTE — Discharge Instructions (Signed)
Do not take your pain medication unless you are also eating food.

## 2014-10-21 NOTE — ED Notes (Signed)
Delay in lab draw,  Pt receiving peri care. 

## 2014-10-21 NOTE — ED Notes (Signed)
Pt requesting to finish bag of fluids before discharge, 340mL left in bag. Will review DC papers then DC.

## 2014-10-27 ENCOUNTER — Encounter (HOSPITAL_COMMUNITY): Payer: Self-pay | Admitting: *Deleted

## 2014-10-27 ENCOUNTER — Emergency Department (HOSPITAL_COMMUNITY)
Admission: EM | Admit: 2014-10-27 | Discharge: 2014-10-27 | Disposition: A | Discharge status: Home / Self Care | Payer: 59 | Attending: Emergency Medicine | Admitting: Emergency Medicine

## 2014-10-27 DIAGNOSIS — F329 Major depressive disorder, single episode, unspecified: Secondary | ICD-10-CM | POA: Insufficient documentation

## 2014-10-27 DIAGNOSIS — S0100XD Unspecified open wound of scalp, subsequent encounter: Secondary | ICD-10-CM | POA: Diagnosis not present

## 2014-10-27 DIAGNOSIS — Z4802 Encounter for removal of sutures: Secondary | ICD-10-CM | POA: Diagnosis present

## 2014-10-27 DIAGNOSIS — S0100XA Unspecified open wound of scalp, initial encounter: Secondary | ICD-10-CM

## 2014-10-27 MED ORDER — ACETAMINOPHEN 325 MG PO TABS
325.0000 mg | ORAL_TABLET | Freq: Once | ORAL | Status: AC
Start: 2014-10-27 — End: 2014-10-27
  Administered 2014-10-27: 325 mg via ORAL
  Filled 2014-10-27: qty 1

## 2014-10-27 MED ORDER — BACITRACIN ZINC 500 UNIT/GM EX OINT: 1.0000 "application " | TOPICAL_OINTMENT | Freq: Two times a day (BID) | CUTANEOUS | Status: DC

## 2014-10-27 MED ORDER — CEPHALEXIN 500 MG PO CAPS: 500.0000 mg | ORAL_CAPSULE | Freq: Four times a day (QID) | ORAL | Status: DC

## 2014-10-27 NOTE — Discharge Instructions (Signed)
Staple Removal, Care After °The staples that were used to close your skin have been removed. The care described here will need to continue until the wound is completely healed and your health care provider confirms that wound care can be stopped. °HOME CARE INSTRUCTIONS  °· Keep the wound site dry and clean. Do not soak it in water. °· If skin adhesive strips were applied after the staples were removed, they will begin to peel off in a few days. Allow them to remain in place until they fall off on their own. °· If you still have a bandage (dressing), change it at least once a day or as directed by your health care provider. If the dressing sticks, pour warm, sterile water over it until it loosens and can be removed without pulling apart the wound edges. Pat dry with a clean towel. °· Apply cream or ointment that stops the growth of bacteria (antibacterial cream or ointment) only if your health care provider has directed you to do so. Place a nonstick bandage over the wound to prevent the dressing from sticking. °· Cover the nonstick bandage with a new dressing as directed by your health care provider. °· If the bandage becomes wet, dirty, or develops a bad smell, change it as soon as possible. °· New scars become sunburned easily. Use sunscreens with a sun protection factor (SPF) of at least 15 when out in the sun. Reapply the SPF every 2 hours. °· Only take medicines as directed by your health care provider. °SEEK IMMEDIATE MEDICAL CARE IF:  °· You have redness, swelling, or increasing pain in the wound. °· You have pus coming from the wound. °· You have a fever. °· You notice a bad smell coming from the wound or dressing. °· Your wound edges open up after staples have been removed. °MAKE SURE YOU:  °· Understand these instructions. °· Will watch your condition. °· Will get help right away if you are not doing well or get worse. °Document Released: 05/01/2008 Document Revised: 05/24/2013 Document Reviewed:  05/01/2008 °[employer]® Patient Information ©2015 [employer]. This information is not intended to replace advice given to you by your health care provider. Make sure you discuss any questions you have with your health care provider. ° °
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
## 2014-11-07 ENCOUNTER — Ambulatory Visit (INDEPENDENT_AMBULATORY_CARE_PROVIDER_SITE_OTHER): Payer: Self-pay | Admitting: Physician Assistant

## 2014-11-07 VITALS — BP 90/56 | HR 62 | Temp 97.3°F | Resp 20 | Ht 65.75 in | Wt 106.0 lb

## 2014-11-07 DIAGNOSIS — N910 Primary amenorrhea: Secondary | ICD-10-CM

## 2014-11-07 DIAGNOSIS — Z30011 Encounter for initial prescription of contraceptive pills: Secondary | ICD-10-CM

## 2014-11-07 DIAGNOSIS — N926 Irregular menstruation, unspecified: Secondary | ICD-10-CM

## 2014-11-07 LAB — POCT URINE PREGNANCY: Preg Test, Ur: NEGATIVE

## 2014-11-07 MED ORDER — NORGESTIMATE-ETH ESTRADIOL 0.25-35 MG-MCG PO TABS: 1.0000 | ORAL_TABLET | Freq: Every day | ORAL | Status: DC
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
## 2014-11-07 NOTE — Patient Instructions (Signed)
I will contact you with your lab results as soon as they are available.   If you have not heard from me in 2 weeks, please contact me.  The fastest way to get your results is to register for My Chart (see the instructions on the last page of this printout).   Oral Contraception Information Oral contraceptive pills (OCPs) are medicines taken to prevent pregnancy. OCPs work by preventing the ovaries from releasing eggs. The hormones in OCPs also cause the cervical mucus to thicken, preventing the sperm from entering the uterus. The hormones also cause the uterine lining to become thin, not allowing a fertilized egg to attach to the inside of the uterus. OCPs are highly effective when taken exactly as prescribed. However, OCPs do not prevent sexually transmitted diseases (STDs). Safe sex practices, such as using condoms along with the pill, can help prevent STDs.  Before taking the pill, you may have a physical exam and Pap test. Your health care provider may order blood tests. The health care provider will make sure you are a good candidate for oral contraception. Discuss with your health care provider the possible side effects of the OCP you may be prescribed. When starting an OCP, it can take 2 to 3 months for the body to adjust to the changes in hormone levels in your body.  TYPES OF ORAL CONTRACEPTION  The combination pill--This pill contains estrogen and progestin (synthetic progesterone) hormones. The combination pill comes in 21-day, 28-day, or 91-day packs. Some types of combination pills are meant to be taken continuously (365-day pills). With 21-day packs, you do not take pills for 7 days after the last pill. With 28-day packs, the pill is taken every day. The last 7 pills are without hormones. Certain types of pills have more than 21 hormone-containing pills. With 91-day packs, the first 84 pills contain both hormones, and the last 7 pills contain no hormones or contain estrogen only.  The  minipill--This pill contains the progesterone hormone only. The pill is taken every day continuously. It is very important to take the pill at the same time each day. The minipill comes in packs of 28 pills. All 28 pills contain the hormone.  ADVANTAGES OF ORAL CONTRACEPTIVE PILLS  Decreases premenstrual symptoms.   Treats menstrual period cramps.   Regulates the menstrual cycle.   Decreases a heavy menstrual flow.   May treatacne, depending on the type of pill.   Treats abnormal uterine bleeding.   Treats polycystic ovarian syndrome.   Treats endometriosis.   Can be used as emergency contraception.  THINGS THAT CAN MAKE ORAL CONTRACEPTIVE PILLS LESS EFFECTIVE OCPs can be less effective if:   You forget to take the pill at the same time every day.   You have a stomach or intestinal disease that lessens the absorption of the pill.   You take OCPs with other medicines that make OCPs less effective, such as antibiotics, certain HIV medicines, and some seizure medicines.   You take expired OCPs.   You forget to restart the pill on day 7, when using the packs of 21 pills.  RISKS ASSOCIATED WITH ORAL CONTRACEPTIVE PILLS  Oral contraceptive pills can sometimes cause side effects, such as:  Headache.  Nausea.  Breast tenderness.  Irregular bleeding or spotting. Combination pills are also associated with a small increased risk of:  Blood clots.  Heart attack.  Stroke. Document Released: 08/09/2002 Document Revised: 03/09/2013 Document Reviewed: 11/07/2012 [facility] Patient Information 2015 [place], [place]. This information  is not intended to replace advice given to you by your health care provider. Make sure you discuss any questions you have with your health care provider.  

## 2014-11-07 NOTE — Progress Notes (Deleted)
   Subjective:    Patient ID: Alyssa Kaiser, female    DOB: 11-30-1985, 30 y.o.   MRN: [id number]  HPI    Review of Systems     Objective:   Physical Exam        Assessment & Plan:

## 2014-11-08 LAB — HCG, QUANTITATIVE, PREGNANCY

## 2014-11-08 LAB — TSH: TSH: 2.605 u[IU]/mL (ref 0.350–4.500)

## 2014-11-09 ENCOUNTER — Encounter: Payer: Self-pay | Admitting: Physician Assistant
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
## 2014-11-10 ENCOUNTER — Telehealth: Payer: Self-pay

## 2014-11-10 NOTE — Telephone Encounter (Signed)
lmom of lab results.

## 2014-11-10 NOTE — Telephone Encounter (Signed)
Her labs are normal. TSH is normal. Serum pregnancy test NEGATIVE.

## 2014-11-10 NOTE — Telephone Encounter (Signed)
Pt calling about labs. Please review.

## 2014-12-11 ENCOUNTER — Ambulatory Visit (INDEPENDENT_AMBULATORY_CARE_PROVIDER_SITE_OTHER): Payer: 59 | Admitting: Family Medicine

## 2014-12-11 VITALS — BP 99/62 | HR 58 | Temp 97.8°F | Resp 18 | Ht 66.0 in | Wt 107.5 lb

## 2014-12-11 DIAGNOSIS — L739 Follicular disorder, unspecified: Secondary | ICD-10-CM | POA: Diagnosis not present

## 2014-12-11 MED ORDER — MUPIROCIN 2 % EX OINT: 1.0000 "application " | TOPICAL_OINTMENT | Freq: Four times a day (QID) | CUTANEOUS | Status: DC

## 2014-12-11 MED ORDER — DOXYCYCLINE HYCLATE 100 MG PO CAPS: 100.0000 mg | ORAL_CAPSULE | Freq: Two times a day (BID) | ORAL | Status: DC

## 2014-12-11 NOTE — Patient Instructions (Signed)

## 2014-12-11 NOTE — Progress Notes (Signed)
Subjective:   This chart was scribed for Dr. Delman Cheadle, MD by Erling Conte, ED Scribe. This patient was seen in Room 2 and the patient's care was started at 8:13 PM.   Patient ID: Alyssa Kaiser, female    DOB: Oct 14, 1985, 29 y.o.   MRN: [id number]  Chief Complaint  Patient presents with  . Mass    under both arms x 1 month & pain  . Groin Swelling    in vaginal area    HPI HPI Comments: Alyssa Kaiser is a 29 y.o. female who presents to the [facility] complaining of a mass under her bilateral arms onset 1 month. Pt states the area is painful to touch. She denies any draining from the area. She states she also has similar mass to to the right side of her labia majora. She states she does not shave those areas but she does wax. She has not used any treatments on the areas. She denies swimming in any new lakes or oceans. She denies any fever, chills, nausea, vomiting, diarrhea, weight change, myalgias, sore throat, swelling in neck or trouble swallowing.   Patient Active Problem List   Diagnosis Date Noted  . Late menses 11/07/2014   Past Medical History  Diagnosis Date  . Depression    Past Surgical History  Procedure Laterality Date  . Appendectomy     No Known Allergies Prior to Admission medications   Medication Sig Start Date End Date Taking? Authorizing Provider  norgestimate-ethinyl estradiol (ORTHO-CYCLEN,SPRINTEC,PREVIFEM) 0.25-35 MG-MCG tablet Take 1 tablet by mouth daily. 11/07/14  Yes Chelle Jeffery, PA-C  bacitracin ointment Apply 1 application topically 2 (two) times daily. Patient not taking: Reported on 12/11/2014 10/27/14   Antonietta Breach, PA-C  traMADol (ULTRAM) 50 MG tablet Take 1 tablet (50 mg total) by mouth every 6 (six) hours as needed for moderate pain. Patient not taking: Reported on 10/21/2014 03/06/14   Seabron Spates, CNM   History   Social History  . Marital Status: Married    Spouse Name: Fnu  . Number of Children: 0  . Years of  Education: N/A   Occupational History  . Not on file.   Social History Main Topics  . Smoking status: Never Smoker   . Smokeless tobacco: Never Used  . Alcohol Use: No  . Drug Use: No  . Sexual Activity:    Partners: Male    Birth Control/ Protection: None   Other Topics Concern  . Not on file   Social History Narrative   From [place].   Lives with her husband (married 09/2014).    Review of Systems  Constitutional: Negative for fever, chills and unexpected weight change.  HENT: Negative for sore throat and trouble swallowing.   Gastrointestinal: Negative for nausea, vomiting and diarrhea.  Musculoskeletal: Negative for myalgias, arthralgias and neck pain.  Skin: Negative for color change and rash.       Positive for mass to bilateral axillary area and to groin       Objective:  BP 99/62 mmHg  Pulse 58  Temp(Src) 97.8 F (36.6 C) (Oral)  Resp 18  Ht 5\' 6"  (1.676 m)  Wt 107 lb 8 oz (48.762 kg)  BMI 17.36 kg/m2  SpO2 98%    Physical Exam  Constitutional: She is oriented to person, place, and time. She appears well-developed and well-nourished. No distress.  HENT:  Head: Normocephalic and atraumatic.  Eyes: Conjunctivae and EOM are normal.  Neck: Neck supple.  No tracheal deviation present.  Cardiovascular: Normal rate.   Pulmonary/Chest: Effort normal. No respiratory distress.  Genitourinary:  Pubic hair entirely waxed. Small 0.5 cm tender subcutaneous, mobile nodule on external labia majora, right near the perineum   Musculoskeletal: Normal range of motion.  Neurological: She is alert and oriented to person, place, and time.  Skin: Skin is warm and dry.  0.5 cm subcutaneous, mobile tender nodules, no erythema or warmth. No induration or fluctuance. 3 on posterior aspect of 3 left axilla and 1 on right posterior axilla near the upper arm  Psychiatric: She has a normal mood and affect. Her behavior is normal.  Nursing note and vitals reviewed.       Assessment  & Plan:   1. Folliculitis   warned of antibiotic side effects. Was on keflex recently so avoid. Warm moist heat to lesions qid followed by top bactroban, do not manipulate/squeeze lesions  Meds ordered this encounter  Medications  . mupirocin ointment (BACTROBAN) 2 %    Sig: Apply 1 application topically 4 (four) times daily.    Dispense:  30 g    Refill:  1  . doxycycline (VIBRAMYCIN) 100 MG capsule    Sig: Take 1 capsule (100 mg total) by mouth 2 (two) times daily.    Dispense:  20 capsule    Refill:  0    I personally performed the services described in this documentation, which was scribed in my presence. The recorded information has been reviewed and considered, and addended by me as needed.  Delman Cheadle, MD MPH

## 2015-01-15 ENCOUNTER — Ambulatory Visit (INDEPENDENT_AMBULATORY_CARE_PROVIDER_SITE_OTHER): Payer: 59 | Admitting: Emergency Medicine

## 2015-01-15 VITALS — BP 112/64 | HR 77 | Temp 97.9°F | Resp 18 | Ht 65.0 in | Wt 109.2 lb

## 2015-01-15 DIAGNOSIS — K2990 Gastroduodenitis, unspecified, without bleeding: Secondary | ICD-10-CM | POA: Diagnosis not present

## 2015-01-15 DIAGNOSIS — K2970 Gastritis, unspecified, without bleeding: Secondary | ICD-10-CM

## 2015-01-15 DIAGNOSIS — N926 Irregular menstruation, unspecified: Secondary | ICD-10-CM

## 2015-01-15 LAB — POCT CBC
GRANULOCYTE PERCENT: 56.6 % (ref 37–80)
HEMATOCRIT: 42.5 % (ref 37.7–47.9)
HEMOGLOBIN: 12.6 g/dL (ref 12.2–16.2)
Lymph, poc: 2.2 (ref 0.6–3.4)
MCH: 27.5 pg (ref 27–31.2)
MCHC: 29.8 g/dL — AB (ref 31.8–35.4)
MCV: 92.4 fL (ref 80–97)
MID (cbc): 0.7 (ref 0–0.9)
MPV: 7.5 fL (ref 0–99.8)
PLATELET COUNT, POC: 360 10*3/uL (ref 142–424)
POC GRANULOCYTE: 3.7 (ref 2–6.9)
POC LYMPH PERCENT: 32.6 %L (ref 10–50)
POC MID %: 10.8 %M (ref 0–12)
RBC: 4.6 M/uL (ref 4.04–5.48)
RDW, POC: 13.4 %
WBC: 6.6 10*3/uL (ref 4.6–10.2)

## 2015-01-15 LAB — POCT URINE PREGNANCY: Preg Test, Ur: NEGATIVE

## 2015-01-15 MED ORDER — PANTOPRAZOLE SODIUM 40 MG PO TBEC: 40.0000 mg | DELAYED_RELEASE_TABLET | Freq: Every day | ORAL | Status: DC

## 2015-01-15 MED ORDER — SUCRALFATE 1 G PO TABS: ORAL_TABLET | ORAL | Status: DC

## 2015-01-15 NOTE — Progress Notes (Signed)
Subjective:  Patient ID: Alyssa Kaiser, female    DOB: 05-10-1986  Age: 29 y.o. MRN: [id number]  CC: Abdominal Pain and Possible Pregnancy   HPI Alyssa Kaiser presents  with abdominal pain. She said she has epigastric pain that doesn't radiate. She has no nausea or vomiting. She has no stool change. She has no blood in her stool or black stool. She doesn't drink alcohol. She is drinks a moderate amount of caffeine. The pain is worse when she does not eat and is relieved with food. She was on medicine for her "stomach" the past that recall what was she denies any symptoms really suggestive of reflux esophagitis. No water brash or burning in her chest. No choking at night. She doesn't take a lot of non-steroidal's her aspirin. He does not smoke  History Evana has a past medical history of Depression.   She has past surgical history that includes Appendectomy.   Her  family history includes Thyroid disease in her mother.  She   reports that she has never smoked. She has never used smokeless tobacco. She reports that she does not drink alcohol or use illicit drugs.  Outpatient Prescriptions Prior to Visit  Medication Sig Dispense Refill  . bacitracin ointment Apply 1 application topically 2 (two) times daily. 15 g 0  . mupirocin ointment (BACTROBAN) 2 % Apply 1 application topically 4 (four) times daily. 30 g 1  . doxycycline (VIBRAMYCIN) 100 MG capsule Take 1 capsule (100 mg total) by mouth 2 (two) times daily. (Patient not taking: Reported on 01/15/2015) 20 capsule 0  . norgestimate-ethinyl estradiol (ORTHO-CYCLEN,SPRINTEC,PREVIFEM) 0.25-35 MG-MCG tablet Take 1 tablet by mouth daily. 3 Package 4   No facility-administered medications prior to visit.    Social History   Social History  . Marital Status: Married    Spouse Name: Fnu  . Number of Children: 0  . Years of Education: N/A   Social History Main Topics  . Smoking status: Never Smoker   . Smokeless tobacco: Never Used  .  Alcohol Use: No  . Drug Use: No  . Sexual Activity:    Partners: Male    Birth Control/ Protection: None   Other Topics Concern  . None   Social History Narrative   From [place].   Lives with her husband (married 09/2014).     Review of Systems  Constitutional: Negative for fever, chills and appetite change.  HENT: Negative for congestion, ear pain, postnasal drip, sinus pressure and sore throat.   Eyes: Negative for pain and redness.  Respiratory: Negative for cough, shortness of breath and wheezing.   Cardiovascular: Negative for leg swelling.  Gastrointestinal: Negative for nausea, vomiting, abdominal pain, diarrhea, constipation and blood in stool.  Endocrine: Negative for polyuria.  Genitourinary: Negative for dysuria, urgency, frequency and flank pain.  Musculoskeletal: Negative for gait problem.  Skin: Negative for rash.  Neurological: Negative for weakness and headaches.  Psychiatric/Behavioral: Negative for confusion and decreased concentration. The patient is not nervous/anxious.     Objective:  BP 112/64 mmHg  Pulse 77  Temp(Src) 97.9 F (36.6 C) (Oral)  Resp 18  Ht 5\' 5"  (1.651 m)  Wt 109 lb 3.2 oz (49.533 kg)  BMI 18.17 kg/m2  SpO2 94%  Physical Exam  Constitutional: She is oriented to person, place, and time. She appears well-developed and well-nourished.  HENT:  Head: Normocephalic and atraumatic.  Eyes: Conjunctivae are normal. Pupils are equal, round, and reactive to light.  Pulmonary/Chest: Effort normal.  Abdominal: Soft. She exhibits no distension. There is no tenderness. There is no guarding.  Musculoskeletal: She exhibits no edema.  Neurological: She is alert and oriented to person, place, and time.  Skin: Skin is dry.  Psychiatric: She has a normal mood and affect. Her behavior is normal. Thought content normal.      Assessment & Plan:   Rudi was seen today for abdominal pain and possible pregnancy.  Diagnoses and all orders for this  visit:  Missed period -     POCT urine pregnancy -     POCT CBC -     Comprehensive metabolic panel -     Lipase -     H. pylori breath test  Gastritis and gastroduodenitis -     POCT CBC -     Comprehensive metabolic panel -     Lipase -     H. pylori breath test  Other orders -     pantoprazole (PROTONIX) 40 MG tablet; Take 1 tablet (40 mg total) by mouth daily. -     sucralfate (CARAFATE) 1 G tablet; 1 tablet 1 hr ac and hs   I am having Ms. Barb start on pantoprazole and sucralfate. I am also having her maintain her bacitracin, norgestimate-ethinyl estradiol, mupirocin ointment, and doxycycline.  Meds ordered this encounter  Medications  . pantoprazole (PROTONIX) 40 MG tablet    Sig: Take 1 tablet (40 mg total) by mouth daily.    Dispense:  30 tablet    Refill:  2  . sucralfate (CARAFATE) 1 G tablet    Sig: 1 tablet 1 hr ac and hs    Dispense:  120 tablet    Refill:  0    Appropriate red flag conditions were discussed with the patient as well as actions that should be taken.  Patient expressed his understanding.  Follow-up: Return if symptoms worsen or fail to improve.  Roselee Culver, MD   Results for orders placed or performed in visit on 01/15/15  POCT urine pregnancy  Result Value Ref Range   Preg Test, Ur Negative Negative  POCT CBC  Result Value Ref Range   WBC 6.6 4.6 - 10.2 K/uL   Lymph, poc 2.2 0.6 - 3.4   POC LYMPH PERCENT 32.6 10 - 50 %L   MID (cbc) 0.7 0 - 0.9   POC MID % 10.8 0 - 12 %M   POC Granulocyte 3.7 2 - 6.9   Granulocyte percent 56.6 37 - 80 %G   RBC 4.60 4.04 - 5.48 M/uL   Hemoglobin 12.6 12.2 - 16.2 g/dL   HCT, POC 42.5 37.7 - 47.9 %   MCV 92.4 80 - 97 fL   MCH, POC 27.5 27 - 31.2 pg   MCHC 29.8 (A) 31.8 - 35.4 g/dL   RDW, POC 13.4 %   Platelet Count, POC 360 142 - 424 K/uL   MPV 7.5 0 - 99.8 fL   Results for orders placed or performed in visit on 01/15/15  POCT urine pregnancy  Result Value Ref Range   Preg Test, Ur  Negative Negative  POCT CBC  Result Value Ref Range   WBC 6.6 4.6 - 10.2 K/uL   Lymph, poc 2.2 0.6 - 3.4   POC LYMPH PERCENT 32.6 10 - 50 %L   MID (cbc) 0.7 0 - 0.9   POC MID % 10.8 0 - 12 %M   POC Granulocyte 3.7 2 - 6.9   Granulocyte percent 56.6 37 -  80 %G   RBC 4.60 4.04 - 5.48 M/uL   Hemoglobin 12.6 12.2 - 16.2 g/dL   HCT, POC 42.5 37.7 - 47.9 %   MCV 92.4 80 - 97 fL   MCH, POC 27.5 27 - 31.2 pg   MCHC 29.8 (A) 31.8 - 35.4 g/dL   RDW, POC 13.4 %   Platelet Count, POC 360 142 - 424 K/uL   MPV 7.5 0 - 99.8 fL

## 2015-01-15 NOTE — Patient Instructions (Signed)
Gastritis Gastritis, Adult Gastritis is soreness and swelling (inflammation) of the lining of the stomach. Gastritis can develop as a sudden onset (acute) or long-term (chronic) condition. If gastritis is not treated, it can lead to stomach bleeding and ulcers. CAUSES  Gastritis occurs when the stomach lining is weak or damaged. Digestive juices from the stomach then inflame the weakened stomach lining. The stomach lining may be weak or damaged due to viral or bacterial infections. One common bacterial infection is the Helicobacter pylori infection. Gastritis can also result from excessive alcohol consumption, taking certain medicines, or having too much acid in the stomach.  SYMPTOMS  In some cases, there are no symptoms. When symptoms are present, they may include:  Pain or a burning sensation in the upper abdomen.  Nausea.  Vomiting.  An uncomfortable feeling of fullness after eating. DIAGNOSIS  Your caregiver may suspect you have gastritis based on your symptoms and a physical exam. To determine the cause of your gastritis, your caregiver may perform the following:  Blood or stool tests to check for the H pylori bacterium.  Gastroscopy. A thin, flexible tube (endoscope) is passed down the esophagus and into the stomach. The endoscope has a light and camera on the end. Your caregiver uses the endoscope to view the inside of the stomach.  Taking a tissue sample (biopsy) from the stomach to examine under a microscope. TREATMENT  Depending on the cause of your gastritis, medicines may be prescribed. If you have a bacterial infection, such as an H pylori infection, antibiotics may be given. If your gastritis is caused by too much acid in the stomach, H2 blockers or antacids may be given. Your caregiver may recommend that you stop taking aspirin, ibuprofen, or other nonsteroidal anti-inflammatory drugs (NSAIDs). HOME CARE INSTRUCTIONS  Only take over-the-counter or prescription medicines as  directed by your caregiver.  If you were given antibiotic medicines, take them as directed. Finish them even if you start to feel better.  Drink enough fluids to keep your urine clear or pale yellow.  Avoid foods and drinks that make your symptoms worse, such as:  Caffeine or alcoholic drinks.  Chocolate.  Peppermint or mint flavorings.  Garlic and onions.  Spicy foods.  Citrus fruits, such as oranges, lemons, or limes.  Tomato-based foods such as sauce, chili, salsa, and pizza.  Fried and fatty foods.  Eat small, frequent meals instead of large meals. SEEK IMMEDIATE MEDICAL CARE IF:   You have black or dark red stools.  You vomit blood or material that looks like coffee grounds.  You are unable to keep fluids down.  Your abdominal pain gets worse.  You have a fever.  You do not feel better after 1 week.  You have any other questions or concerns. MAKE SURE YOU:  Understand these instructions.  Will watch your condition.  Will get help right away if you are not doing well or get worse. Document Released: 05/13/2001 Document Revised: 11/18/2011 Document Reviewed: 07/02/2011 [facility] Patient Information 2015 [place], [place]. This information is not intended to replace advice given to you by your health care provider. Make sure you discuss any questions you have with your health care provider.

## 2015-01-16 ENCOUNTER — Other Ambulatory Visit: Payer: Self-pay | Admitting: Emergency Medicine

## 2015-01-16 LAB — COMPREHENSIVE METABOLIC PANEL
ALBUMIN: 4.5 g/dL (ref 3.6–5.1)
ALT: 27 U/L (ref 6–29)
AST: 27 U/L (ref 10–30)
Alkaline Phosphatase: 50 U/L (ref 33–115)
BILIRUBIN TOTAL: 0.2 mg/dL (ref 0.2–1.2)
BUN: 14 mg/dL (ref 7–25)
CHLORIDE: 100 mmol/L (ref 98–110)
CO2: 29 mmol/L (ref 20–31)
Calcium: 10.1 mg/dL (ref 8.6–10.2)
Creat: 0.64 mg/dL (ref 0.50–1.10)
Glucose, Bld: 104 mg/dL — ABNORMAL HIGH (ref 65–99)
Potassium: 4.4 mmol/L (ref 3.5–5.3)
SODIUM: 137 mmol/L (ref 135–146)
Total Protein: 8.5 g/dL — ABNORMAL HIGH (ref 6.1–8.1)

## 2015-01-16 LAB — H. PYLORI BREATH TEST: H. PYLORI BREATH TEST: DETECTED — AB

## 2015-01-16 LAB — LIPASE: Lipase: 32 U/L (ref 7–60)

## 2015-01-16 MED ORDER — AMOXICILL-CLARITHRO-LANSOPRAZ PO MISC
Freq: Two times a day (BID) | ORAL | Status: DC
Start: 2015-01-16 — End: 2015-07-16

## 2015-01-21 ENCOUNTER — Encounter: Payer: Self-pay | Admitting: Family Medicine

## 2015-01-22 ENCOUNTER — Telehealth: Payer: Self-pay

## 2015-01-22 NOTE — Telephone Encounter (Signed)
What she already has is ok.  Just tell her to take double the dose for two weeks

## 2015-01-22 NOTE — Telephone Encounter (Signed)
Female left mes that pt's stomach medicine was much too expensive. I called pharm because a PrevPak was Rxd but in comments Dr Ouida Sills asked them to separate into generic components. Pharm reported that they had not done this yet and that they would and let me know if there was any problem with separate Rxs. I received a PA notice for lansoprazole 30 mg caps. Dr Ouida Sills, pt already p/up the pantoprazole that you had Rxd the day before. OK to just have her take it instead of the prevacid? I had pharm call pt to advise the other two Abxs ready and to have her take it w/pantoprazole, and I will call pt back if you want her to take a different PPI.

## 2015-01-23 NOTE — Telephone Encounter (Signed)
I called pt again, and only got VM again. I explained in detail how she is to take the Abxs along with twice dose of pantoprazole for 2 weeks and then return to the dose of pantoprazole on the bottle to continue it. Asked for call back if they have any questions about this.

## 2015-01-25 ENCOUNTER — Telehealth: Payer: Self-pay

## 2015-01-25 ENCOUNTER — Other Ambulatory Visit: Payer: Self-pay | Admitting: Emergency Medicine

## 2015-01-25 DIAGNOSIS — K2990 Gastroduodenitis, unspecified, without bleeding: Principal | ICD-10-CM

## 2015-01-25 DIAGNOSIS — K2970 Gastritis, unspecified, without bleeding: Secondary | ICD-10-CM

## 2015-01-25 NOTE — Telephone Encounter (Signed)
Referred to GI

## 2015-01-25 NOTE — Telephone Encounter (Signed)
Spoke with pt, she is having diarrhea while taking the ABX. She went twice last night and saw that it was a watery stool. She stopped the medication this morning. She made sure to eat with the medication. Please advise.

## 2015-01-29 ENCOUNTER — Encounter: Payer: Self-pay | Admitting: Gastroenterology

## 2015-01-29 NOTE — Telephone Encounter (Signed)
Left message for pt to let her know.

## 2015-02-14 ENCOUNTER — Ambulatory Visit: Payer: Self-pay | Admitting: Gastroenterology

## 2015-02-16 ENCOUNTER — Ambulatory Visit: Payer: Self-pay | Admitting: Gastroenterology

## 2015-03-29 ENCOUNTER — Ambulatory Visit: Payer: Self-pay | Admitting: Gastroenterology

## 2015-05-15 ENCOUNTER — Ambulatory Visit (INDEPENDENT_AMBULATORY_CARE_PROVIDER_SITE_OTHER): Payer: 59 | Admitting: Physician Assistant

## 2015-05-15 VITALS — BP 110/80 | HR 57 | Temp 98.2°F | Resp 18 | Ht 66.0 in | Wt 110.0 lb

## 2015-05-15 DIAGNOSIS — A048 Other specified bacterial intestinal infections: Secondary | ICD-10-CM

## 2015-05-15 DIAGNOSIS — R1013 Epigastric pain: Secondary | ICD-10-CM | POA: Diagnosis not present

## 2015-05-15 DIAGNOSIS — B9681 Helicobacter pylori [H. pylori] as the cause of diseases classified elsewhere: Secondary | ICD-10-CM | POA: Diagnosis not present

## 2015-05-15 DIAGNOSIS — Z9114 Patient's other noncompliance with medication regimen: Secondary | ICD-10-CM | POA: Diagnosis not present

## 2015-05-15 LAB — POCT CBC
GRANULOCYTE PERCENT: 54.6 % (ref 37–80)
HEMATOCRIT: 37.4 % — AB (ref 37.7–47.9)
HEMOGLOBIN: 13 g/dL (ref 12.2–16.2)
Lymph, poc: 2.2 (ref 0.6–3.4)
MCH: 30.4 pg (ref 27–31.2)
MCHC: 34.8 g/dL (ref 31.8–35.4)
MCV: 87.2 fL (ref 80–97)
MID (CBC): 0.6 (ref 0–0.9)
MPV: 7.3 fL (ref 0–99.8)
POC GRANULOCYTE: 3.4 (ref 2–6.9)
POC LYMPH PERCENT: 36.2 %L (ref 10–50)
POC MID %: 9.2 % (ref 0–12)
Platelet Count, POC: 307 10*3/uL (ref 142–424)
RBC: 4.29 M/uL (ref 4.04–5.48)
RDW, POC: 12.8 %
WBC: 6.2 10*3/uL (ref 4.6–10.2)

## 2015-05-15 LAB — COMPLETE METABOLIC PANEL WITH GFR
ALT: 25 U/L (ref 6–29)
AST: 24 U/L (ref 10–30)
Albumin: 4.5 g/dL (ref 3.6–5.1)
Alkaline Phosphatase: 43 U/L (ref 33–115)
BUN: 13 mg/dL (ref 7–25)
CALCIUM: 9.6 mg/dL (ref 8.6–10.2)
CO2: 28 mmol/L (ref 20–31)
CREATININE: 0.68 mg/dL (ref 0.50–1.10)
Chloride: 101 mmol/L (ref 98–110)
GFR, Est Non African American: >89 mL/min (ref >=60)
Glucose, Bld: 80 mg/dL (ref 65–99)
POTASSIUM: 4.1 mmol/L (ref 3.5–5.3)
Sodium: 138 mmol/L (ref 135–146)
Total Bilirubin: 0.3 mg/dL (ref 0.2–1.2)
Total Protein: 7.8 g/dL (ref 6.1–8.1)

## 2015-05-15 MED ORDER — PANTOPRAZOLE SODIUM 40 MG PO TBEC: 40.0000 mg | DELAYED_RELEASE_TABLET | Freq: Every day | ORAL | Status: DC

## 2015-05-15 MED ORDER — SUCRALFATE 1 G PO TABS: ORAL_TABLET | ORAL | Status: DC

## 2015-05-15 NOTE — Patient Instructions (Addendum)
Please await contact for your appointment.  This should be within the next few days.  She is sending a referral to [facility].  Please make this appointment.   Food Choices for Gastroesophageal Reflux Disease, Adult When you have gastroesophageal reflux disease (GERD), the foods you eat and your eating habits are very important. Choosing the right foods can help ease the discomfort of GERD. WHAT GENERAL GUIDELINES DO I NEED TO FOLLOW?  Choose fruits, vegetables, whole grains, low-fat dairy products, and low-fat meat, fish, and poultry.  Limit fats such as oils, salad dressings, butter, nuts, and avocado.  Keep a food diary to identify foods that cause symptoms.  Avoid foods that cause reflux. These may be different for different people.  Eat frequent small meals instead of three large meals each day.  Eat your meals slowly, in a relaxed setting.  Limit fried foods.  Cook foods using methods other than frying.  Avoid drinking alcohol.  Avoid drinking large amounts of liquids with your meals.  Avoid bending over or lying down until 2-3 hours after eating. WHAT FOODS ARE NOT RECOMMENDED? The following are some foods and drinks that may worsen your symptoms: Vegetables Tomatoes. Tomato juice. Tomato and spaghetti sauce. Chili peppers. Onion and garlic. Horseradish. Fruits Oranges, grapefruit, and lemon (fruit and juice). Meats High-fat meats, fish, and poultry. This includes hot dogs, ribs, ham, sausage, salami, and bacon. Dairy Whole milk and chocolate milk. Sour cream. Cream. Butter. Ice cream. Cream cheese.  Beverages Coffee and tea, with or without caffeine. Carbonated beverages or energy drinks. Condiments Hot sauce. Barbecue sauce.  Sweets/Desserts Chocolate and cocoa. Donuts. Peppermint and spearmint. Fats and Oils High-fat foods, including [place] fries and potato chips. Other Vinegar. Strong spices, such as black pepper, white pepper, red pepper, cayenne,  curry powder, cloves, ginger, and chili powder. The items listed above may not be a complete list of foods and beverages to avoid. Contact your dietitian for more information.   This information is not intended to replace advice given to you by your health care provider. Make sure you discuss any questions you have with your health care provider.   Document Released: 05/19/2005 Document Revised: 06/09/2014 Document Reviewed: 03/23/2013 Elsevier Interactive Patient Education 2016 [place]? Test results that are higher than normal may indicate numerous health conditions. These may include:  Short-term or long-term irritation of the stomach lining (gastritis).  Small bowel ulcer.  Stomach ulcer.  Stomach cancer. Talk with your health care provider to discuss your results, treatment options, and if necessary, the need for more tests. Talk with your health care provider if you have any questions about your results.   This information is not intended to replace advice given to you by your health care provider. Make sure you discuss any questions you have with your health care provider.   Document Released: 06/12/2004 Document Revised: 06/09/2014 Document Reviewed: 09/30/2013 Elsevier Interactive Patient Education [employer].

## 2015-05-15 NOTE — Progress Notes (Signed)
[facility] [address] [place] [address] [phone]  Date:  05/15/2015   Name:  Alyssa Kaiser   DOB:  Oct 27, 1985   MRN:  [id number]  PCP:  Pcp Not In System    History of Present Illness:  Alyssa Kaiser is a 29 y.o. female patient who presents to [facility] for cc for continued epigastric pain.   Pain is intermittent, but is worse in the morning and late night.  She is no longer taking any medications, including PPI, or carafate.  She will drink milk and honey, which result in soothing the symptoms.   She has also taken Risek (omeprazole??) She has some nausea, resulting in dry heaves.  No blood in stool or darkened school.   --patient was dxd with h pylori with a breath test.  Given a prevpak.  By the second day, experienced severe nausea which caused her to stop taking the medication.  She has not taken any of the secondary medications given.  She had a referral to [facility] but failed to make her appt., due to work schedule.  She reports being in severe pain.  No dixxiness. No dysuria, hematuria, or frequency.   Patient Active Problem List   Diagnosis Date Noted  . Late menses 11/07/2014    Past Medical History  Diagnosis Date  . Depression     Past Surgical History  Procedure Laterality Date  . Appendectomy      Social History  Substance Use Topics  . Smoking status: Never Smoker   . Smokeless tobacco: Never Used  . Alcohol Use: No    Family History  Problem Relation Age of Onset  . Thyroid disease Mother     No Known Allergies  Medication list has been reviewed and updated.  Current Outpatient Prescriptions on File Prior to Visit  Medication Sig Dispense Refill  . norgestimate-ethinyl estradiol (ORTHO-CYCLEN,SPRINTEC,PREVIFEM) 0.25-35 MG-MCG tablet Take 1 tablet by mouth daily. 3 Package 4  . amoxicillin-clarithromycin-lansoprazole (PREVPAC) combo pack Take by mouth 2 (two) times daily. Follow package directions.  Dispense as generic  components (Patient not taking: Reported on 05/15/2015) 1 kit 0  . bacitracin ointment Apply 1 application topically 2 (two) times daily. (Patient not taking: Reported on 05/15/2015) 15 g 0  . doxycycline (VIBRAMYCIN) 100 MG capsule Take 1 capsule (100 mg total) by mouth 2 (two) times daily. (Patient not taking: Reported on 01/15/2015) 20 capsule 0  . mupirocin ointment (BACTROBAN) 2 % Apply 1 application topically 4 (four) times daily. (Patient not taking: Reported on 05/15/2015) 30 g 1  . pantoprazole (PROTONIX) 40 MG tablet Take 1 tablet (40 mg total) by mouth daily. (Patient not taking: Reported on 05/15/2015) 30 tablet 2  . sucralfate (CARAFATE) 1 G tablet 1 tablet 1 hr ac and hs (Patient not taking: Reported on 05/15/2015) 120 tablet 0   No current facility-administered medications on file prior to visit.    ROS ROS otherwise unremarkable unless listed above.   Physical Examination: BP 110/80 mmHg  Pulse 57  Temp(Src) 98.2 F (36.8 C) (Oral)  Resp 18  Ht _0  (1.676 m)  Wt 110 lb (49.896 kg)  BMI 17.76 kg/m2  SpO2 99%  LMP 04/30/2015 Ideal Body Weight: Weight in (lb) to have BMI = 25: 154.6 Wt Readings from Last 3 Encounters:  05/15/15 110 lb (49.896 kg)  01/15/15 109 lb 3.2 oz (49.533 kg)  12/11/14 107 lb 8 oz (48.762 kg)   Physical Exam  Constitutional:  She is oriented to person, place, and time. She appears well-developed and well-nourished. No distress.  HENT:  Head: Normocephalic and atraumatic.  Right Ear: External ear normal.  Left Ear: External ear normal.  Eyes: Conjunctivae and EOM are normal. Pupils are equal, round, and reactive to light.  Cardiovascular: Normal rate and regular rhythm.  Exam reveals no gallop and no friction rub.   No murmur heard. Pulmonary/Chest: Effort normal. No apnea. No respiratory distress. She has no decreased breath sounds. She has no wheezes. She has no rhonchi.  Abdominal: Soft. Normal appearance and bowel sounds are normal. There  is no hepatosplenomegaly. There is tenderness in the right upper quadrant and epigastric area. There is no tenderness at McBurney's point and negative Murphy's sign.  Neurological: She is alert and oriented to person, place, and time.  Skin: She is not diaphoretic.  Psychiatric: She has a normal mood and affect. Her behavior is normal.    Results for orders placed or performed in visit on 05/15/15  POCT CBC  Result Value Ref Range   WBC 6.2 4.6 - 10.2 K/uL   Lymph, poc 2.2 0.6 - 3.4   POC LYMPH PERCENT 36.2 10 - 50 %L   MID (cbc) 0.6 0 - 0.9   POC MID % 9.2 0 - 12 %M   POC Granulocyte 3.4 2 - 6.9   Granulocyte percent 54.6 37 - 80 %G   RBC 4.29 4.04 - 5.48 M/uL   Hemoglobin 13.0 12.2 - 16.2 g/dL   HCT, POC 37.4 (A) 37.7 - 47.9 %   MCV 87.2 80 - 97 fL   MCH, POC 30.4 27 - 31.2 pg   MCHC 34.8 31.8 - 35.4 g/dL   RDW, POC 12.8 %   Platelet Count, POC 307 142 - 424 K/uL   MPV 7.3 0 - 99.8 fL    Assessment and Plan: Alyssa Kaiser is a 29 y.o. female who is here today for chief complaint of continued epigastric pain.  The H pylori has been left untreated and still the likely cause of her pain.   Protonix and Carafate for support.   I am reissuing a consult.  This will likely be with [place] considerable effort to make her appointment at this time.  It sounds as if she is non-compliant in regards to her treatment and lifestyle modifications.  Advised lifestyle changes with anti-GERD handout.     H. pylori infection - Plan: sucralfate (CARAFATE) 1 G tablet, pantoprazole (PROTONIX) 40 MG tablet, Ambulatory referral to Gastroenterology  Abdominal pain, epigastric - Plan: POCT CBC, COMPLETE METABOLIC PANEL WITH GFR, sucralfate (CARAFATE) 1 G tablet, pantoprazole (PROTONIX) 40 MG tablet, Ambulatory referral to Gastroenterology  Non compliance w medication regimen - Plan: Ambulatory referral to Gastroenterology   Ivar Drape, PA-C [facility] 05/15/2015 1:14 PM

## 2015-05-16 NOTE — Progress Notes (Signed)
  Medical screening examination/treatment/procedure(s) were performed by non-physician practitioner and as supervising physician I was immediately available for consultation/collaboration.     

## 2015-05-17 ENCOUNTER — Telehealth: Payer: Self-pay

## 2015-05-17 NOTE — Telephone Encounter (Signed)
Contacted patient who is doing better on the carafate and protonix.  Appears that there is no question about redoing the h pylori breath test--we had discussed this at the visit as this was not treated appropriately (they reported that she took the medication and caused nausea, and she never finished).  Advised to continue medication, and await call for consult.

## 2015-05-17 NOTE — Telephone Encounter (Signed)
Notes Recorded by Constance Goltz, CMA on 05/17/2015 at 8:37 AM Left message on machine to call back Notes Recorded by Joretta Bachelor, PA on 05/17/2015 at 7:43 AM Please alert that her kidney and liver function are normal. Cbc showed no anemia. This abdominal pain, still likely the result of the h pylori infection. Please await contact for appt.  Spoke to pt regarding results. She said she was never treated for H-Pylori in August. She never picked up the medication for treatment. Do we need to retest pt and treat? Or just treat?

## 2015-05-27 ENCOUNTER — Emergency Department (HOSPITAL_COMMUNITY): Payer: 59

## 2015-05-27 ENCOUNTER — Encounter (HOSPITAL_COMMUNITY): Payer: Self-pay | Admitting: *Deleted

## 2015-05-27 ENCOUNTER — Emergency Department (HOSPITAL_COMMUNITY)
Admission: EM | Admit: 2015-05-27 | Discharge: 2015-05-27 | Disposition: A | Discharge status: Home / Self Care | Payer: 59 | Attending: Emergency Medicine | Admitting: Emergency Medicine

## 2015-05-27 DIAGNOSIS — Y9389 Activity, other specified: Secondary | ICD-10-CM | POA: Diagnosis not present

## 2015-05-27 DIAGNOSIS — Y998 Other external cause status: Secondary | ICD-10-CM | POA: Insufficient documentation

## 2015-05-27 DIAGNOSIS — S199XXA Unspecified injury of neck, initial encounter: Secondary | ICD-10-CM | POA: Diagnosis not present

## 2015-05-27 DIAGNOSIS — Y92410 Unspecified street and highway as the place of occurrence of the external cause: Secondary | ICD-10-CM | POA: Insufficient documentation

## 2015-05-27 DIAGNOSIS — S3992XA Unspecified injury of lower back, initial encounter: Secondary | ICD-10-CM | POA: Insufficient documentation

## 2015-05-27 DIAGNOSIS — Z3202 Encounter for pregnancy test, result negative: Secondary | ICD-10-CM | POA: Insufficient documentation

## 2015-05-27 DIAGNOSIS — Z8659 Personal history of other mental and behavioral disorders: Secondary | ICD-10-CM | POA: Insufficient documentation

## 2015-05-27 DIAGNOSIS — Z79899 Other long term (current) drug therapy: Secondary | ICD-10-CM | POA: Insufficient documentation

## 2015-05-27 DIAGNOSIS — R110 Nausea: Secondary | ICD-10-CM | POA: Insufficient documentation

## 2015-05-27 DIAGNOSIS — Z793 Long term (current) use of hormonal contraceptives: Secondary | ICD-10-CM | POA: Insufficient documentation

## 2015-05-27 DIAGNOSIS — S3991XA Unspecified injury of abdomen, initial encounter: Secondary | ICD-10-CM | POA: Insufficient documentation

## 2015-05-27 LAB — I-STAT CHEM 8, ED
BUN: 9 mg/dL (ref 6–20)
Calcium, Ion: 1.15 mmol/L (ref 1.12–1.23)
Chloride: 105 mmol/L (ref 101–111)
Creatinine, Ser: 0.6 mg/dL (ref 0.44–1.00)
Glucose, Bld: 96 mg/dL (ref 65–99)
HCT: 38 % (ref 36.0–46.0)
Hemoglobin: 12.9 g/dL (ref 12.0–15.0)
Potassium: 3.8 mmol/L (ref 3.5–5.1)
Sodium: 141 mmol/L (ref 135–145)
TCO2: 24 mmol/L (ref 0–100)

## 2015-05-27 LAB — CBC
HCT: 36.1 % (ref 36.0–46.0)
Hemoglobin: 12.1 g/dL (ref 12.0–15.0)
MCH: 29.8 pg (ref 26.0–34.0)
MCHC: 33.5 g/dL (ref 30.0–36.0)
MCV: 88.9 fL (ref 78.0–100.0)
PLATELETS: 283 10*3/uL (ref 150–400)
RBC: 4.06 MIL/uL (ref 3.87–5.11)
RDW: 12.4 % (ref 11.5–15.5)
WBC: 6.8 10*3/uL (ref 4.0–10.5)

## 2015-05-27 LAB — POC URINE PREG, ED: PREG TEST UR: NEGATIVE

## 2015-05-27 MED ORDER — IOHEXOL 300 MG/ML  SOLN
100.0000 mL | Freq: Once | INTRAMUSCULAR | Status: AC | PRN
  Administered 2015-05-27: 100 mL via INTRAVENOUS

## 2015-05-27 MED ORDER — MORPHINE SULFATE (PF) 4 MG/ML IV SOLN
4.0000 mg | Freq: Once | INTRAVENOUS | Status: AC
  Administered 2015-05-27: 4 mg via INTRAVENOUS
  Filled 2015-05-27: qty 1

## 2015-05-27 MED ORDER — IBUPROFEN 800 MG PO TABS: 800.0000 mg | ORAL_TABLET | Freq: Three times a day (TID) | ORAL | Status: DC

## 2015-05-27 MED ORDER — HYDROCODONE-ACETAMINOPHEN 5-325 MG PO TABS: 1.0000 | ORAL_TABLET | Freq: Four times a day (QID) | ORAL | Status: DC | PRN

## 2015-05-27 MED ORDER — CYCLOBENZAPRINE HCL 10 MG PO TABS: 10.0000 mg | ORAL_TABLET | Freq: Two times a day (BID) | ORAL | Status: DC | PRN

## 2015-05-27 NOTE — Discharge Instructions (Signed)
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
## 2015-06-05 ENCOUNTER — Other Ambulatory Visit: Payer: Self-pay

## 2015-06-05 DIAGNOSIS — S3991XA Unspecified injury of abdomen, initial encounter: Secondary | ICD-10-CM | POA: Diagnosis not present

## 2015-07-16 ENCOUNTER — Ambulatory Visit (INDEPENDENT_AMBULATORY_CARE_PROVIDER_SITE_OTHER): Payer: BLUE CROSS/BLUE SHIELD | Admitting: Women's Health

## 2015-07-16 ENCOUNTER — Encounter: Payer: Self-pay | Admitting: Women's Health

## 2015-07-16 ENCOUNTER — Other Ambulatory Visit (HOSPITAL_COMMUNITY)
Admission: RE | Admit: 2015-07-16 | Discharge: 2015-07-16 | Disposition: A | Discharge status: Home / Self Care | Payer: BLUE CROSS/BLUE SHIELD | Source: Ambulatory Visit | Attending: Women's Health | Admitting: Women's Health

## 2015-07-16 VITALS — BP 118/78 | Ht 66.0 in | Wt 110.0 lb

## 2015-07-16 DIAGNOSIS — Z01419 Encounter for gynecological examination (general) (routine) without abnormal findings: Secondary | ICD-10-CM | POA: Insufficient documentation

## 2015-07-16 DIAGNOSIS — Z1151 Encounter for screening for human papillomavirus (HPV): Secondary | ICD-10-CM | POA: Diagnosis not present

## 2015-07-16 DIAGNOSIS — Z30011 Encounter for initial prescription of contraceptive pills: Secondary | ICD-10-CM

## 2015-07-16 DIAGNOSIS — L298 Other pruritus: Secondary | ICD-10-CM | POA: Diagnosis not present

## 2015-07-16 DIAGNOSIS — R21 Rash and other nonspecific skin eruption: Secondary | ICD-10-CM

## 2015-07-16 DIAGNOSIS — L689 Hypertrichosis, unspecified: Secondary | ICD-10-CM

## 2015-07-16 DIAGNOSIS — N898 Other specified noninflammatory disorders of vagina: Secondary | ICD-10-CM

## 2015-07-16 LAB — TSH: TSH: 3.02 mIU/L

## 2015-07-16 MED ORDER — BETAMETHASONE VALERATE 0.1 % EX OINT: 1.0000 "application " | TOPICAL_OINTMENT | Freq: Two times a day (BID) | CUTANEOUS | Status: DC

## 2015-07-16 MED ORDER — NORGESTIMATE-ETH ESTRADIOL 0.25-35 MG-MCG PO TABS: 1.0000 | ORAL_TABLET | Freq: Every day | ORAL | Status: DC

## 2015-07-16 MED ORDER — TERCONAZOLE 0.4 % VA CREA: 1.0000 | TOPICAL_CREAM | Freq: Every day | VAGINAL | Status: DC

## 2015-07-16 NOTE — Patient Instructions (Signed)
Health Maintenance, Female Adopting a healthy lifestyle and getting preventive care can go a long way to promote health and wellness. Talk with your health care provider about what schedule of regular examinations is right for you. This is a good chance for you to check in with your provider about disease prevention and staying healthy. In between checkups, there are plenty of things you can do on your own. Experts have done a lot of research about which lifestyle changes and preventive measures are most likely to keep you healthy. Ask your health care provider for more information. WEIGHT AND DIET  Eat a healthy diet  Be sure to include plenty of vegetables, fruits, low-fat dairy products, and lean protein.  Do not eat a lot of foods high in solid fats, added sugars, or salt.  Get regular exercise. This is one of the most important things you can do for your health.  Most adults should exercise for at least 150 minutes each week. The exercise should increase your heart rate and make you sweat (moderate-intensity exercise).  Most adults should also do strengthening exercises at least twice a week. This is in addition to the moderate-intensity exercise.  Maintain a healthy weight  Body mass index (BMI) is a measurement that can be used to identify possible weight problems. It estimates body fat based on height and weight. Your health care provider can help determine your BMI and help you achieve or maintain a healthy weight.  For females 20 years of age and older:   A BMI below 18.5 is considered underweight.  A BMI of 18.5 to 24.9 is normal.  A BMI of 25 to 29.9 is considered overweight.  A BMI of 30 and above is considered obese.  Watch levels of cholesterol and blood lipids  You should start having your blood tested for lipids and cholesterol at 31 years of age, then have this test every 5 years.  You may need to have your cholesterol levels checked more often if:  Your lipid  or cholesterol levels are high.  You are older than 31 years of age.  You are at high risk for heart disease.  CANCER SCREENING   Lung Cancer  Lung cancer screening is recommended for adults 55-80 years old who are at high risk for lung cancer because of a history of smoking.  A yearly low-dose CT scan of the lungs is recommended for people who:  Currently smoke.  Have quit within the past 15 years.  Have at least a 30-pack-year history of smoking. A pack year is smoking an average of one pack of cigarettes a day for 1 year.  Yearly screening should continue until it has been 15 years since you quit.  Yearly screening should stop if you develop a health problem that would prevent you from having lung cancer treatment.  Breast Cancer  Practice breast self-awareness. This means understanding how your breasts normally appear and feel.  It also means doing regular breast self-exams. Let your health care provider know about any changes, no matter how small.  If you are in your 20s or 30s, you should have a clinical breast exam (CBE) by a health care provider every 1-3 years as part of a regular health exam.  If you are 40 or older, have a CBE every year. Also consider having a breast X-ray (mammogram) every year.  If you have a family history of breast cancer, talk to your health care provider about genetic screening.  If you   are at high risk for breast cancer, talk to your health care provider about having an MRI and a mammogram every year.  Breast cancer gene (BRCA) assessment is recommended for women who have family members with BRCA-related cancers. BRCA-related cancers include:  Breast.  Ovarian.  Tubal.  Peritoneal cancers.  Results of the assessment will determine the need for genetic counseling and BRCA1 and BRCA2 testing. Cervical Cancer Your health care provider may recommend that you be screened regularly for cancer of the pelvic organs (ovaries, uterus, and  vagina). This screening involves a pelvic examination, including checking for microscopic changes to the surface of your cervix (Pap test). You may be encouraged to have this screening done every 3 years, beginning at age 21.  For women ages 30-65, health care providers may recommend pelvic exams and Pap testing every 3 years, or they may recommend the Pap and pelvic exam, combined with testing for human papilloma virus (HPV), every 5 years. Some types of HPV increase your risk of cervical cancer. Testing for HPV may also be done on women of any age with unclear Pap test results.  Other health care providers may not recommend any screening for nonpregnant women who are considered low risk for pelvic cancer and who do not have symptoms. Ask your health care provider if a screening pelvic exam is right for you.  If you have had past treatment for cervical cancer or a condition that could lead to cancer, you need Pap tests and screening for cancer for at least 20 years after your treatment. If Pap tests have been discontinued, your risk factors (such as having a new sexual partner) need to be reassessed to determine if screening should resume. Some women have medical problems that increase the chance of getting cervical cancer. In these cases, your health care provider may recommend more frequent screening and Pap tests. Colorectal Cancer  This type of cancer can be detected and often prevented.  Routine colorectal cancer screening usually begins at 31 years of age and continues through 31 years of age.  Your health care provider may recommend screening at an earlier age if you have risk factors for colon cancer.  Your health care provider may also recommend using home test kits to check for hidden blood in the stool.  A small camera at the end of a tube can be used to examine your colon directly (sigmoidoscopy or colonoscopy). This is done to check for the earliest forms of colorectal  cancer.  Routine screening usually begins at age 50.  Direct examination of the colon should be repeated every 5-10 years through 31 years of age. However, you may need to be screened more often if early forms of precancerous polyps or small growths are found. Skin Cancer  Check your skin from head to toe regularly.  Tell your health care provider about any new moles or changes in moles, especially if there is a change in a mole's shape or color.  Also tell your health care provider if you have a mole that is larger than the size of a pencil eraser.  Always use sunscreen. Apply sunscreen liberally and repeatedly throughout the day.  Protect yourself by wearing long sleeves, pants, a wide-brimmed hat, and sunglasses whenever you are outside. HEART DISEASE, DIABETES, AND HIGH BLOOD PRESSURE   High blood pressure causes heart disease and increases the risk of stroke. High blood pressure is more likely to develop in:  People who have blood pressure in the high end   of the normal range (130-139/85-89 mm Hg).  People who are overweight or obese.  People who are African American.  If you are 38-23 years of age, have your blood pressure checked every 3-5 years. If you are 61 years of age or older, have your blood pressure checked every year. You should have your blood pressure measured twice--once when you are at a hospital or clinic, and once when you are not at a hospital or clinic. Record the average of the two measurements. To check your blood pressure when you are not at a hospital or clinic, you can use:  An automated blood pressure machine at a pharmacy.  A home blood pressure monitor.  If you are between 45 years and 39 years old, ask your health care provider if you should take aspirin to prevent strokes.  Have regular diabetes screenings. This involves taking a blood sample to check your fasting blood sugar level.  If you are at a normal weight and have a low risk for diabetes,  have this test once every three years after 31 years of age.  If you are overweight and have a high risk for diabetes, consider being tested at a younger age or more often. PREVENTING INFECTION  Hepatitis B  If you have a higher risk for hepatitis B, you should be screened for this virus. You are considered at high risk for hepatitis B if:  You were born in a country where hepatitis B is common. Ask your health care provider which countries are considered high risk.  Your parents were born in a high-risk country, and you have not been immunized against hepatitis B (hepatitis B vaccine).  You have HIV or AIDS.  You use needles to inject street drugs.  You live with someone who has hepatitis B.  You have had sex with someone who has hepatitis B.  You get hemodialysis treatment.  You take certain medicines for conditions, including cancer, organ transplantation, and autoimmune conditions. Hepatitis C  Blood testing is recommended for:  Everyone born from [birth year] through 1966.  Anyone with known risk factors for hepatitis C. Sexually transmitted infections (STIs)  You should be screened for sexually transmitted infections (STIs) including gonorrhea and chlamydia if:  You are sexually active and are younger than 31 years of age.  You are older than 31 years of age and your health care provider tells you that you are at risk for this type of infection.  Your sexual activity has changed since you were last screened and you are at an increased risk for chlamydia or gonorrhea. Ask your health care provider if you are at risk.  If you do not have HIV, but are at risk, it may be recommended that you take a prescription medicine daily to prevent HIV infection. This is called pre-exposure prophylaxis (PrEP). You are considered at risk if:  You are sexually active and do not regularly use condoms or know the HIV status of your partner(s).  You take drugs by injection.  You are sexually  active with a partner who has HIV. Talk with your health care provider about whether you are at high risk of being infected with HIV. If you choose to begin PrEP, you should first be tested for HIV. You should then be tested every 3 months for as long as you are taking PrEP.  PREGNANCY   If you are premenopausal and you may become pregnant, ask your health care provider about preconception counseling.  If you may  become pregnant, take 400 to 800 micrograms (mcg) of folic acid every day.  If you want to prevent pregnancy, talk to your health care provider about birth control (contraception). OSTEOPOROSIS AND MENOPAUSE   Osteoporosis is a disease in which the bones lose minerals and strength with aging. This can result in serious bone fractures. Your risk for osteoporosis can be identified using a bone density scan.  If you are 61 years of age or older, or if you are at risk for osteoporosis and fractures, ask your health care provider if you should be screened.  Ask your health care provider whether you should take a calcium or vitamin D supplement to lower your risk for osteoporosis.  Menopause may have certain physical symptoms and risks.  Hormone replacement therapy may reduce some of these symptoms and risks. Talk to your health care provider about whether hormone replacement therapy is right for you.  HOME CARE INSTRUCTIONS   Schedule regular health, dental, and eye exams.  Stay current with your immunizations.   Do not use any tobacco products including cigarettes, chewing tobacco, or electronic cigarettes.  If you are pregnant, do not drink alcohol.  If you are breastfeeding, limit how much and how often you drink alcohol.  Limit alcohol intake to no more than 1 drink per day for nonpregnant women. One drink equals 12 ounces of beer, 5 ounces of wine, or 1 ounces of hard liquor.  Do not use street drugs.  Do not share needles.  Ask your health care provider for help if  you need support or information about quitting drugs.  Tell your health care provider if you often feel depressed.  Tell your health care provider if you have ever been abused or do not feel safe at home.   This information is not intended to replace advice given to you by your health care provider. Make sure you discuss any questions you have with your health care provider.   Document Released: 12/02/2010 Document Revised: 06/09/2014 Document Reviewed: 04/20/2013 Elsevier Interactive Patient Education [employer].

## 2015-07-16 NOTE — Progress Notes (Signed)
Alyssa Kaiser July 14, 1985 [id number]    History:    Presents for annual exam.  New patient presents for GYN exam and review contraception. First partner both 7 months ago has had some trouble remembering birth control pills cycle is 1 week late. Monthly cycle prior to starting pills. Did not receive gardasil. Has not had a Pap. Had normal labs at primary care. Has increased facial hair growth.  Past medical history, past surgical history, family history and social history were all reviewed and documented in the EPIC chart. 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   ROS:  A ROS was performed and pertinent positives and negatives are included.  Exam:  Filed Vitals:   07/16/15 0950  BP: 118/78    General appearance:  Normal Thyroid:  Symmetrical, normal in size, without palpable masses or nodularity. Respiratory  Auscultation:  Clear without wheezing or rhonchi Cardiovascular  Auscultation:  Regular rate, without rubs, murmurs or gallops  Edema/varicosities:  Not grossly evident Abdominal  Soft,nontender, without masses, guarding or rebound.  Liver/spleen:  No organomegaly noted  Hernia:  None appreciated  Skin  Inspection:  Grossly normal   Breasts: Examined lying and sitting.     Right: Without masses, retractions, discharge or axillary adenopathy.     Left: Without masses, retractions, discharge or axillary adenopathy. Gentitourinary   Inguinal/mons:  Normal without inguinal adenopathy  External genitalia:  Normal  BUS/Urethra/Skene's glands:  Normal  Vagina:  Normal  Cervix:  Normal  Uterus:  normal in size, shape and contour.  Midline and mobile  Adnexa/parametria:     Rt: Without masses or tenderness.   Lt: Without masses or tenderness.  Anus and perineum: Normal    Assessment/Plan:  30 y.o. MF G0 for annual exam complaining of facial hair growth and cycle late  5 days.  Contraception management Underweight  Plan: Has had negative UPT's at home, will await cycle start OCPs back on first day of bleeding. Will continue prescription of Sprintec, renewal given. Reviewed slight risk of blood clots and strokes. Condoms until and first month back on encouraged. Other options reviewed and declined. Instructed to call if cycle does not start. SBE's, exercise, calcium rich diet, MVI daily encouraged. TSH, testosterone, UA, Pap with HR R HPV typing, new screening guidelines reviewed.    [place], 1:46 PM 07/16/2015

## 2015-07-17 ENCOUNTER — Other Ambulatory Visit: Payer: Self-pay | Admitting: Women's Health

## 2015-07-17 LAB — URINALYSIS W MICROSCOPIC + REFLEX CULTURE
BACTERIA UA: NONE SEEN [HPF]
BILIRUBIN URINE: NEGATIVE
CRYSTALS: NONE SEEN [HPF]
Casts: NONE SEEN [LPF]
GLUCOSE, UA: NEGATIVE
HGB URINE DIPSTICK: NEGATIVE
Ketones, ur: NEGATIVE
LEUKOCYTES UA: NEGATIVE
Nitrite: NEGATIVE
PROTEIN: NEGATIVE
RBC / HPF: NONE SEEN RBC/HPF (ref <=2)
SQUAMOUS EPITHELIAL / LPF: NONE SEEN [HPF] (ref <=5)
Specific Gravity, Urine: 1.019 (ref 1.001–1.035)
WBC, UA: NONE SEEN WBC/HPF (ref <=5)
YEAST: NONE SEEN [HPF]
pH: 5.5 (ref 5.0–8.0)

## 2015-07-17 LAB — CYTOLOGY - PAP

## 2015-07-17 LAB — TESTOSTERONE: TESTOSTERONE: 61 ng/dL

## 2015-07-17 MED ORDER — DESOGESTREL-ETHINYL ESTRADIOL 0.15-0.02/0.01 MG (21/5) PO TABS: 1.0000 | ORAL_TABLET | Freq: Every day | ORAL | Status: DC

## 2015-07-23 ENCOUNTER — Telehealth: Payer: Self-pay | Admitting: *Deleted

## 2015-07-23 NOTE — Telephone Encounter (Signed)
Pt called regarding questions with birth control pills states she received 2 different pills from pharmacy. One Rx was for [facility] which is generic for Sprintic, I told her safe to take. Pt then said you gave her a sample multivitamin 3 packs to take which pt said have expired Aug.2016. Pt is worried asking if safe to still take? Please advise

## 2015-07-24 NOTE — Telephone Encounter (Signed)
Left message for pt to call.

## 2015-07-24 NOTE — Telephone Encounter (Signed)
Yes, review generic vs brand name, ok for MVI, a little less potent if expired but not a problem

## 2015-08-23 ENCOUNTER — Telehealth: Payer: Self-pay | Admitting: *Deleted

## 2015-08-23 NOTE — Telephone Encounter (Signed)
Pt husband left message in triage voicemail pt is having issues with birth control pills and cycles. I called pt at her direct # and asked her to call me to talk about this.

## 2015-08-23 NOTE — Telephone Encounter (Signed)
Phone call, states is planning to stop pills altogether will use condoms. Has had several missed pills. Would like to start a family soon. Continue prenatal vitamins daily. Instructed to call if cycles do not regulate.

## 2015-08-23 NOTE — Telephone Encounter (Signed)
Message left to call

## 2015-08-23 NOTE — Telephone Encounter (Signed)
Pt called c/o nausea, heavy bleeding, with birth control pills. Pt said she had a cycle 2 weeks ago and cycle started again this week, medium flow now, but yesterday bleeding was heavier. Pt asked if pills could be switched? Please advise
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
## 2015-11-05 ENCOUNTER — Ambulatory Visit (INDEPENDENT_AMBULATORY_CARE_PROVIDER_SITE_OTHER): Payer: BLUE CROSS/BLUE SHIELD | Admitting: Physician Assistant

## 2015-11-05 VITALS — BP 110/76 | HR 64 | Temp 97.5°F | Resp 18 | Ht 65.5 in | Wt 117.0 lb

## 2015-11-05 DIAGNOSIS — H6123 Impacted cerumen, bilateral: Secondary | ICD-10-CM | POA: Diagnosis not present

## 2015-11-05 DIAGNOSIS — N910 Primary amenorrhea: Secondary | ICD-10-CM

## 2015-11-05 DIAGNOSIS — N926 Irregular menstruation, unspecified: Secondary | ICD-10-CM

## 2015-11-05 DIAGNOSIS — R110 Nausea: Secondary | ICD-10-CM | POA: Diagnosis not present

## 2015-11-05 DIAGNOSIS — N644 Mastodynia: Secondary | ICD-10-CM | POA: Diagnosis not present

## 2015-11-05 LAB — POCT URINE PREGNANCY: PREG TEST UR: NEGATIVE

## 2015-11-05 MED ORDER — RANITIDINE HCL 150 MG PO TABS: 150.0000 mg | ORAL_TABLET | Freq: Two times a day (BID) | ORAL | Status: DC

## 2015-11-05 NOTE — Patient Instructions (Addendum)
Despite the negative test here in the office today, it is possible that you are early in pregnancy.  It is ok to continue the pills, as they will not cause you to miscarry if you are pregnant.  It is also ok to stop them and use condoms every single time.  If you do not start your period in the next 2 weeks, please return here or to [facility] for repeat pregnancy test.    IF you received an x-ray today, you will receive an invoice from [facility]. Please contact [facility] at [phone] with questions or concerns regarding your invoice.   IF you received labwork today, you will receive an invoice from [employer]. Please contact [facility] at [phone] with questions or concerns regarding your invoice.   Our billing staff will not be able to assist you with questions regarding bills from these companies.  You will be contacted with the lab results as soon as they are available. The fastest way to get your results is to activate your My Chart account. Instructions are located on the last page of this paperwork. If you have not heard from [place] regarding the results in 2 weeks, please contact this office.

## 2015-11-05 NOTE — Progress Notes (Signed)
Patient ID: Alyssa Kaiser, female    DOB: [birth year], 30 y.o.   MRN: [id number]  PCP: Pcp Not In System  Subjective:   Chief Complaint  Patient presents with  . Nausea    x 2 days with breast pain     HPI Presents for evaluation of nausea and breast tenderness x 3 days. She is accompanied by her husband.  Nausea is constant. No alleviating factors. Tried emetrol without benefit. No worse with eating. Last BM was this morning, and was normal. No urinary urgency, frequency or burning. No headache or dizziness. Breast tenderness is not a typical premenstrual symptom for her.  Started back on Sprintec following discontinuation x 1 week in late March. Notes from GYN indicate that she was having trouble remembering to take the pills consistently, and then decided to try to get pregnant. She states that she only went without pills x 1 week, and then decided that they were not ready to conceive and she restarted them, and now requests a refill. She and her husband estimate a maximum of 4 missed pills since restarting them 2 months ago.  LMP 10/03/2015, noticeably lighter than usual. Prior to this, her menses were regular when on the pills. She notes that she was missing some pills during the week of bleeding. Prior to using COC, she had some irregularity in her menses, often by 1-2 weeks.  Also complains of the feeling of water in her ears following showering. History of cerumen impaction requiring irrigation annually. She has not had that done in several years.   Review of Systems As above.    Patient Active Problem List   Diagnosis Date Noted  . Late menses 11/07/2014     Prior to Admission medications   Medication Sig Start Date End Date Taking? Authorizing Provider  norgestimate-ethinyl estradiol (ORTHO-CYCLEN,SPRINTEC,PREVIFEM) 0.25-35 MG-MCG tablet Take 1 tablet by mouth daily.   Yes Historical Provider, MD     Allergies  Allergen Reactions  . Other     Pt has a  stomach ulcer and is unable to take certain medication(s) but is unable to recall name of medication       Objective:  Physical Exam  Constitutional: She is oriented to person, place, and time. She appears well-developed and well-nourished. She is active and cooperative. No distress.  BP 110/76 mmHg  Pulse 64  Temp(Src) 97.5 F (36.4 C) (Oral)  Resp 18  Ht 5' 5.5" (1.664 m)  Wt 117 lb (53.071 kg)  BMI 19.17 kg/m2  SpO2 100%  LMP 10/03/2015  HENT:  Head: Normocephalic and atraumatic.  Right Ear: Hearing normal.  Left Ear: Hearing normal.  Cerumen impaction bilaterally. Resolved with irrigation.  Eyes: Conjunctivae are normal. No scleral icterus.  Neck: Normal range of motion. Neck supple. No thyromegaly present.  Cardiovascular: Normal rate, regular rhythm and normal heart sounds.   Pulses:      Radial pulses are 2+ on the right side, and 2+ on the left side.  Pulmonary/Chest: Effort normal and breath sounds normal.  Abdominal: Normal appearance and bowel sounds are normal. She exhibits no distension, no pulsatile liver and no mass. There is no hepatosplenomegaly. There is no tenderness.  Lymphadenopathy:       Head (right side): No tonsillar, no preauricular, no posterior auricular and no occipital adenopathy present.       Head (left side): No tonsillar, no preauricular, no posterior auricular and no occipital adenopathy present.    She has no cervical adenopathy.  Right: No supraclavicular adenopathy present.       Left: No supraclavicular adenopathy present.  Neurological: She is alert and oriented to person, place, and time. No sensory deficit.  Skin: Skin is warm, dry and intact. No rash noted. No cyanosis or erythema. Nails show no clubbing.  Psychiatric: She has a normal mood and affect. Her speech is normal and behavior is normal.       Results for orders placed or performed in visit on 11/05/15  POCT urine pregnancy  Result Value Ref Range   Preg Test, Ur  Negative Negative       Assessment & Plan:   1. Breast tenderness in female 2. Nausea without vomiting 3. Late menses While these symptoms certainly may be due to pregnancy, we have a negative test today. Treat nausea with H2 blocker for now. If negative serum Quant, plan to watch for the next 2 weeks and repeat testing then if no menses. - POCT urine pregnancy - hCG, quantitative, pregnancy - ranitidine (ZANTAC) 150 MG tablet; Take 1 tablet (150 mg total) by mouth 2 (two) times daily.  Dispense: 60 tablet; Refill: 0   4. Cerumen impaction, bilateral Resolved with irrigation. - Ear Lavage   Fara Chute, PA-C Physician Assistant-Certified [facility]

## 2015-11-07 ENCOUNTER — Telehealth: Payer: Self-pay

## 2015-11-07 LAB — HCG, QUANTITATIVE, PREGNANCY

## 2015-11-07 NOTE — Telephone Encounter (Signed)
Talked to Philis Fendt regarding patient and husband calling.  He said that it is ok to give them the results, however, it is not ok to be cursing at the staff.  He advised me to forward the messages to him and Engineer, building services.  I did this

## 2015-11-07 NOTE — Telephone Encounter (Signed)
Telephone call from patient's husband.  He is wanting to patient's results from her pregnancy test (quantum serum).  I can see where the result is negative but it does not look like you have reviewed it yet.  Also, I do not see a hippa form with him on it.  He states he is worried about his wife's health, however, he states she is at work and results should be called to him..his cell phone number is [phone]. Please advise, Thanks!

## 2015-11-07 NOTE — Telephone Encounter (Signed)
Pt's  husband  called back  very upset that I couldn't read him the results. I let him know that the provider has to read over them before the results can be released. He is not even on her Hippa. He starting cussing and yelling. Catilyn spoke with him and he started saying-she was threatening him. She did not. We let him know-we would give her a call when the results had been read. Please advise

## 2015-11-07 NOTE — Telephone Encounter (Signed)
I called patient back and gave her the results of her tests.  She stated that she is still having sx and I advised her to RTC for re-evaluation.  She stated that she would probably go to ED for evaluation and I told her that would be fine.  Whatever she preferred.

## 2015-11-07 NOTE — Telephone Encounter (Signed)
Telephone call from patient herself requesting results.  Told her we are trying very hard to get them for her today.  She said please help her if possible.

## 2016-01-10 ENCOUNTER — Other Ambulatory Visit: Payer: Self-pay | Admitting: Physician Assistant

## 2016-01-10 DIAGNOSIS — A048 Other specified bacterial intestinal infections: Secondary | ICD-10-CM

## 2016-01-10 DIAGNOSIS — R1013 Epigastric pain: Secondary | ICD-10-CM

## 2016-01-11 NOTE — Telephone Encounter (Signed)
Chelle, you mentioned that pt has ulcer in your OV notes from June, but sucralfate is not on pt's current med list. Last written Dec 2016. Do you want to RF or have pt RTC?

## 2016-06-04 ENCOUNTER — Emergency Department (HOSPITAL_COMMUNITY)
Admission: EM | Admit: 2016-06-04 | Discharge: 2016-06-05 | Disposition: A | Discharge status: Home / Self Care | Payer: BLUE CROSS/BLUE SHIELD | Attending: Physician Assistant | Admitting: Physician Assistant

## 2016-06-04 ENCOUNTER — Emergency Department (HOSPITAL_COMMUNITY): Payer: BLUE CROSS/BLUE SHIELD

## 2016-06-04 ENCOUNTER — Encounter (HOSPITAL_COMMUNITY): Payer: Self-pay | Admitting: Emergency Medicine

## 2016-06-04 DIAGNOSIS — Y999 Unspecified external cause status: Secondary | ICD-10-CM | POA: Insufficient documentation

## 2016-06-04 DIAGNOSIS — Y92410 Unspecified street and highway as the place of occurrence of the external cause: Secondary | ICD-10-CM | POA: Insufficient documentation

## 2016-06-04 DIAGNOSIS — Y939 Activity, unspecified: Secondary | ICD-10-CM | POA: Diagnosis not present

## 2016-06-04 DIAGNOSIS — S161XXA Strain of muscle, fascia and tendon at neck level, initial encounter: Secondary | ICD-10-CM

## 2016-06-04 DIAGNOSIS — S199XXA Unspecified injury of neck, initial encounter: Secondary | ICD-10-CM | POA: Diagnosis present

## 2016-06-04 DIAGNOSIS — S39012A Strain of muscle, fascia and tendon of lower back, initial encounter: Secondary | ICD-10-CM | POA: Insufficient documentation

## 2016-06-04 LAB — I-STAT BETA HCG BLOOD, ED (MC, WL, AP ONLY): I-stat hCG, quantitative: <5 m[IU]/mL (ref <5)

## 2016-06-04 MED ORDER — ACETAMINOPHEN 325 MG PO TABS
650.0000 mg | ORAL_TABLET | Freq: Once | ORAL | Status: AC
  Administered 2016-06-04: 650 mg via ORAL
  Filled 2016-06-04: qty 2
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
## 2016-06-05 MED ORDER — CYCLOBENZAPRINE HCL 5 MG PO TABS: 5.0000 mg | ORAL_TABLET | Freq: Two times a day (BID) | ORAL | 0 refills | Status: DC | PRN

## 2016-06-12 ENCOUNTER — Ambulatory Visit (INDEPENDENT_AMBULATORY_CARE_PROVIDER_SITE_OTHER): Payer: BLUE CROSS/BLUE SHIELD | Admitting: Family Medicine

## 2016-06-12 ENCOUNTER — Ambulatory Visit (INDEPENDENT_AMBULATORY_CARE_PROVIDER_SITE_OTHER): Payer: BLUE CROSS/BLUE SHIELD

## 2016-06-12 VITALS — BP 108/64 | HR 53 | Temp 97.5°F | Resp 18 | Ht 66.0 in | Wt 117.0 lb

## 2016-06-12 DIAGNOSIS — R1084 Generalized abdominal pain: Secondary | ICD-10-CM | POA: Diagnosis not present

## 2016-06-12 DIAGNOSIS — R0781 Pleurodynia: Secondary | ICD-10-CM | POA: Diagnosis not present

## 2016-06-12 LAB — POCT URINALYSIS DIP (MANUAL ENTRY)
BILIRUBIN UA: NEGATIVE
Bilirubin, UA: NEGATIVE
Glucose, UA: NEGATIVE
Nitrite, UA: NEGATIVE
PROTEIN UA: NEGATIVE
RBC UA: NEGATIVE
Spec Grav, UA: >=1.03
UROBILINOGEN UA: 0.2
pH, UA: 5.5

## 2016-06-12 LAB — POC MICROSCOPIC URINALYSIS (UMFC): MUCUS RE: ABSENT

## 2016-06-12 LAB — POCT URINE PREGNANCY: PREG TEST UR: NEGATIVE

## 2016-06-12 MED ORDER — TRAMADOL HCL 50 MG PO TABS: 50.0000 mg | ORAL_TABLET | Freq: Three times a day (TID) | ORAL | 0 refills | Status: DC | PRN

## 2016-06-12 MED ORDER — MELOXICAM 15 MG PO TABS: 15.0000 mg | ORAL_TABLET | Freq: Every day | ORAL | 0 refills | Status: DC
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
## 2016-06-12 NOTE — Patient Instructions (Addendum)
Take Meloxicam 15 mg once daily for 14 days for inflammation.  For acute pain, take Tramadol 50 mg every 8 hours as needed for rib pain.   IF you received an x-ray today, you will receive an invoice from [facility]. Please contact [facility] at [phone] with questions or concerns regarding your invoice.   IF you received labwork today, you will receive an invoice from [facility]. Please contact [facility] at [phone] with questions or concerns regarding your invoice.   Our billing staff will not be able to assist you with questions regarding bills from these companies.  You will be contacted with the lab results as soon as they are available. The fastest way to get your results is to activate your My Chart account. Instructions are located on the last page of this paperwork. If you have not heard from [place] regarding the results in 2 weeks, please contact this office.      Chest Wall Pain Chest wall pain is pain in or around the bones and muscles of your chest. Sometimes, an injury causes this pain. Sometimes, the cause may not be known. This pain may take several weeks or longer to get better. Follow these instructions at home: Pay attention to any changes in your symptoms. Take these actions to help with your pain:  Rest as told by your health care provider.  Avoid activities that cause pain. These include any activities that use your chest muscles or your abdominal and side muscles to lift heavy items.  If directed, apply ice to the painful area:  Put ice in a plastic bag.  Place a towel between your skin and the bag.  Leave the ice on for 20 minutes, 2-3 times per day.  Take over-the-counter and prescription medicines only as told by your health care provider.  Do not use tobacco products, including cigarettes, chewing tobacco, and e-cigarettes. If you need help quitting, ask your health care provider.  Keep all follow-up visits as told by your health  care provider. This is important. Contact a health care provider if:  You have a fever.  Your chest pain becomes worse.  You have new symptoms. Get help right away if:  You have nausea or vomiting.  You feel sweaty or light-headed.  You have a cough with phlegm (sputum) or you cough up blood.  You develop shortness of breath. This information is not intended to replace advice given to you by your health care provider. Make sure you discuss any questions you have with your health care provider. Document Released: 05/19/2005 Document Revised: 09/27/2015 Document Reviewed: 08/14/2014 Elsevier Interactive Patient Education  2017 [employer].

## 2016-07-18 ENCOUNTER — Encounter: Payer: Self-pay | Admitting: Women's Health

## 2016-07-18 ENCOUNTER — Ambulatory Visit (INDEPENDENT_AMBULATORY_CARE_PROVIDER_SITE_OTHER): Payer: BLUE CROSS/BLUE SHIELD | Admitting: Women's Health

## 2016-07-18 VITALS — Ht 66.0 in | Wt 118.0 lb

## 2016-07-18 DIAGNOSIS — N912 Amenorrhea, unspecified: Secondary | ICD-10-CM | POA: Diagnosis not present

## 2016-07-18 DIAGNOSIS — Z01419 Encounter for gynecological examination (general) (routine) without abnormal findings: Secondary | ICD-10-CM | POA: Diagnosis not present

## 2016-07-18 LAB — CBC WITH DIFFERENTIAL/PLATELET
Basophils Absolute: 60 cells/uL (ref 0–200)
Basophils Relative: 1 %
Eosinophils Absolute: 120 cells/uL (ref 15–500)
Eosinophils Relative: 2 %
HEMATOCRIT: 38.8 % (ref 35.0–45.0)
Hemoglobin: 13 g/dL (ref 11.7–15.5)
LYMPHS PCT: 29 %
Lymphs Abs: 1740 cells/uL (ref 850–3900)
MCH: 30.9 pg (ref 27.0–33.0)
MCHC: 33.5 g/dL (ref 32.0–36.0)
MCV: 92.2 fL (ref 80.0–100.0)
MONO ABS: 420 {cells}/uL (ref 200–950)
MPV: 10 fL (ref 7.5–12.5)
Monocytes Relative: 7 %
NEUTROS PCT: 61 %
Neutro Abs: 3660 cells/uL (ref 1500–7800)
Platelets: 347 10*3/uL (ref 140–400)
RBC: 4.21 MIL/uL (ref 3.80–5.10)
RDW: 13.2 % (ref 11.0–15.0)
WBC: 6 10*3/uL (ref 3.8–10.8)

## 2016-07-18 LAB — TSH: TSH: 3 mIU/L

## 2016-07-18 MED ORDER — MEDROXYPROGESTERONE ACETATE 10 MG PO TABS: 10.0000 mg | ORAL_TABLET | Freq: Every day | ORAL | 0 refills | Status: DC

## 2016-07-18 MED ORDER — MEDROXYPROGESTERONE ACETATE 10 MG PO TABS: 5.0000 mg | ORAL_TABLET | Freq: Every day | ORAL | 0 refills | Status: DC
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
## 2016-07-18 NOTE — Patient Instructions (Signed)

## 2016-07-19 LAB — FOLLICLE STIMULATING HORMONE: FSH: 3.2 m[IU]/mL

## 2016-07-19 LAB — PROLACTIN: Prolactin: 25.1 ng/mL

## 2016-07-19 LAB — HCG, SERUM, QUALITATIVE: PREG SERUM: NEGATIVE

## 2016-07-24 NOTE — Progress Notes (Signed)
No she does she does not need to take the Provera

## 2016-09-28 ENCOUNTER — Encounter (HOSPITAL_COMMUNITY): Payer: Self-pay | Admitting: *Deleted

## 2016-09-28 ENCOUNTER — Inpatient Hospital Stay (HOSPITAL_COMMUNITY): Payer: BLUE CROSS/BLUE SHIELD

## 2016-09-28 ENCOUNTER — Inpatient Hospital Stay (HOSPITAL_COMMUNITY)
Admission: AD | Admit: 2016-09-28 | Discharge: 2016-09-28 | Disposition: A | Discharge status: Home / Self Care | Payer: BLUE CROSS/BLUE SHIELD | Source: Ambulatory Visit | Attending: Gynecology | Admitting: Gynecology

## 2016-09-28 DIAGNOSIS — N83202 Unspecified ovarian cyst, left side: Secondary | ICD-10-CM | POA: Insufficient documentation

## 2016-09-28 DIAGNOSIS — R1032 Left lower quadrant pain: Secondary | ICD-10-CM | POA: Insufficient documentation

## 2016-09-28 DIAGNOSIS — R109 Unspecified abdominal pain: Secondary | ICD-10-CM

## 2016-09-28 DIAGNOSIS — D252 Subserosal leiomyoma of uterus: Secondary | ICD-10-CM | POA: Diagnosis not present

## 2016-09-28 DIAGNOSIS — F329 Major depressive disorder, single episode, unspecified: Secondary | ICD-10-CM | POA: Diagnosis not present

## 2016-09-28 DIAGNOSIS — Z3202 Encounter for pregnancy test, result negative: Secondary | ICD-10-CM

## 2016-09-28 DIAGNOSIS — N83201 Unspecified ovarian cyst, right side: Secondary | ICD-10-CM

## 2016-09-28 LAB — URINALYSIS, ROUTINE W REFLEX MICROSCOPIC
BACTERIA UA: NONE SEEN
BILIRUBIN URINE: NEGATIVE
Bilirubin Urine: NEGATIVE
Glucose, UA: NEGATIVE mg/dL
Glucose, UA: NEGATIVE mg/dL
HGB URINE DIPSTICK: NEGATIVE
Hgb urine dipstick: NEGATIVE
KETONES UR: NEGATIVE mg/dL
Ketones, ur: NEGATIVE mg/dL
Leukocytes, UA: NEGATIVE
Nitrite: NEGATIVE
Nitrite: NEGATIVE
PROTEIN: NEGATIVE mg/dL
PROTEIN: NEGATIVE mg/dL
SPECIFIC GRAVITY, URINE: 1.001 — AB (ref 1.005–1.030)
Specific Gravity, Urine: 1.012 (ref 1.005–1.030)
pH: 6 (ref 5.0–8.0)
pH: 7 (ref 5.0–8.0)

## 2016-09-28 LAB — CBC
HCT: 38.1 % (ref 36.0–46.0)
Hemoglobin: 13.2 g/dL (ref 12.0–15.0)
MCH: 31.1 pg (ref 26.0–34.0)
MCHC: 34.6 g/dL (ref 30.0–36.0)
MCV: 89.9 fL (ref 78.0–100.0)
PLATELETS: 314 10*3/uL (ref 150–400)
RBC: 4.24 MIL/uL (ref 3.87–5.11)
RDW: 12.4 % (ref 11.5–15.5)
WBC: 10.5 10*3/uL (ref 4.0–10.5)

## 2016-09-28 LAB — POCT PREGNANCY, URINE: Preg Test, Ur: NEGATIVE

## 2016-09-28 LAB — HCG, QUANTITATIVE, PREGNANCY: hCG, Beta Chain, Quant, S: <1 m[IU]/mL (ref <5)

## 2016-09-28 MED ORDER — KETOROLAC TROMETHAMINE 60 MG/2ML IM SOLN
60.0000 mg | Freq: Once | INTRAMUSCULAR | Status: AC
  Administered 2016-09-28: 60 mg via INTRAMUSCULAR
  Filled 2016-09-28: qty 2

## 2016-09-28 NOTE — MAU Note (Signed)
Pt presents to MAU with complaints of lower abdominal cramping with sharp pains in her left side. Constipation for the last three days. Denies any VB or abnormal discharge

## 2016-09-28 NOTE — MAU Provider Note (Signed)
History     CSN: [id number]  Arrival date and time: 09/28/16 1649  First Provider Initiated Contact with Patient 09/28/16 1755      Chief Complaint  Patient presents with  . Abdominal Pain   HPI Alyssa Kaiser is a 31 y.o. [place] female who presents for abdominal pain. Symptoms began last night. Reports LLQ & left flank pain that is constant. Describes as sharp, shooting pains. Rates pain 8/10. Took 1 ES tylenol this morning without relief. Denies fever/chills, dysuria, hematuria, vaginal discharge, vaginal bleeding. Has had increased urinary frequency since arriving to MAU. LMP 08/07/16; no contraception. Has hx of irregular cycles. Is currently trying to conceive.   Past Medical History:  Diagnosis Date  . Depression     Past Surgical History:  Procedure Laterality Date  . APPENDECTOMY      Family History  Problem Relation Age of Onset  . Thyroid disease Mother     Social History  Substance Use Topics  . Smoking status: Never Smoker  . Smokeless tobacco: Never Used  . Alcohol use No    Allergies:  Allergies  Allergen Reactions  . Other     Pt has a stomach ulcer and is unable to take certain medication(s) but is unable to recall name of medication    Prescriptions Prior to Admission  Medication Sig Dispense Refill Last Dose  . medroxyPROGESTERone (PROVERA) 10 MG tablet Take 1 tablet (10 mg total) by mouth daily. 5 tablet 0     Review of Systems  Constitutional: Negative.   Gastrointestinal: Positive for abdominal distention and abdominal pain. Negative for constipation, diarrhea, nausea and vomiting.  Genitourinary: Positive for flank pain and frequency. Negative for decreased urine volume, difficulty urinating, dyspareunia, dysuria, hematuria, pelvic pain, vaginal bleeding and vaginal discharge.   Physical Exam   Blood pressure 105/64, pulse 79, temperature 97.7 F (36.5 C), resp. rate 16, height 5\' 6"  (1.676 m), weight 119 lb (54 kg), last menstrual period  08/07/2016.  Physical Exam  Nursing note and vitals reviewed. Constitutional: She is oriented to person, place, and time. She appears well-developed and well-nourished. She appears distressed.  HENT:  Head: Normocephalic and atraumatic.  Eyes: Conjunctivae are normal. Right eye exhibits no discharge. Left eye exhibits no discharge. No scleral icterus.  Neck: Normal range of motion.  Cardiovascular: Normal rate, regular rhythm and normal heart sounds.   No murmur heard. Respiratory: Effort normal and breath sounds normal. No respiratory distress. She has no wheezes.  GI: Soft. Bowel sounds are normal. She exhibits no distension. There is tenderness (LLQ>RLQ) in the right lower quadrant and left lower quadrant. There is CVA tenderness (left). There is no rigidity, no rebound and no guarding.  Genitourinary: Uterus is enlarged and tender. Cervix exhibits no motion tenderness. Right adnexum displays tenderness. Left adnexum displays tenderness.  Neurological: She is alert and oriented to person, place, and time.  Skin: Skin is warm and dry. She is not diaphoretic.  Psychiatric: She has a normal mood and affect. Her behavior is normal. Judgment and thought content normal.    MAU Course  Procedures Results for orders placed or performed during the hospital encounter of 09/28/16 (from the past 24 hour(s))  Urinalysis, Routine w reflex microscopic     Status: Abnormal   Collection Time: 09/28/16  4:56 PM  Result Value Ref Range   Color, Urine YELLOW YELLOW   APPearance CLEAR CLEAR   Specific Gravity, Urine 1.012 1.005 - 1.030   pH 6.0 5.0 - 8.0  Glucose, UA NEGATIVE NEGATIVE mg/dL   Hgb urine dipstick NEGATIVE NEGATIVE   Bilirubin Urine NEGATIVE NEGATIVE   Ketones, ur NEGATIVE NEGATIVE mg/dL   Protein, ur NEGATIVE NEGATIVE mg/dL   Nitrite NEGATIVE NEGATIVE   Leukocytes, UA SMALL (A) NEGATIVE   RBC / HPF 0-5 0 - 5 RBC/hpf   WBC, UA 6-30 0 - 5 WBC/hpf   Bacteria, UA NONE SEEN NONE SEEN    Squamous Epithelial / LPF 6-30 (A) NONE SEEN  Urinalysis, Routine w reflex microscopic     Status: Abnormal   Collection Time: 09/28/16  5:00 PM  Result Value Ref Range   Color, Urine STRAW (A) YELLOW   APPearance CLEAR CLEAR   Specific Gravity, Urine 1.001 (L) 1.005 - 1.030   pH 7.0 5.0 - 8.0   Glucose, UA NEGATIVE NEGATIVE mg/dL   Hgb urine dipstick NEGATIVE NEGATIVE   Bilirubin Urine NEGATIVE NEGATIVE   Ketones, ur NEGATIVE NEGATIVE mg/dL   Protein, ur NEGATIVE NEGATIVE mg/dL   Nitrite NEGATIVE NEGATIVE   Leukocytes, UA NEGATIVE NEGATIVE  Pregnancy, urine POC     Status: None   Collection Time: 09/28/16  5:06 PM  Result Value Ref Range   Preg Test, Ur NEGATIVE NEGATIVE  CBC     Status: None   Collection Time: 09/28/16  6:14 PM  Result Value Ref Range   WBC 10.5 4.0 - 10.5 K/uL   RBC 4.24 3.87 - 5.11 MIL/uL   Hemoglobin 13.2 12.0 - 15.0 g/dL   HCT 38.1 36.0 - 46.0 %   MCV 89.9 78.0 - 100.0 fL   MCH 31.1 26.0 - 34.0 pg   MCHC 34.6 30.0 - 36.0 g/dL   RDW 12.4 11.5 - 15.5 %   Platelets 314 150 - 400 K/uL  hCG, quantitative, pregnancy     Status: None   Collection Time: 09/28/16  6:14 PM  Result Value Ref Range   hCG, Beta Chain, Quant, S <1 <5 mIU/mL   US Transvaginal Non-ob  Result Date: 09/28/2016 CLINICAL DATA:  Left lower quadrant abdominal pain. EXAM: TRANSABDOMINAL AND TRANSVAGINAL ULTRASOUND OF PELVIS DOPPLER ULTRASOUND OF OVARIES TECHNIQUE: Both transabdominal and transvaginal ultrasound examinations of the pelvis were performed. Transabdominal technique was performed for global imaging of the pelvis including uterus, ovaries, adnexal regions, and pelvic cul-de-sac. It was necessary to proceed with endovaginal exam following the transabdominal exam to visualize the adnexa. Color and duplex Doppler ultrasound was utilized to evaluate blood flow to the ovaries. COMPARISON:  None. FINDINGS: Uterus Measurements: 10.1 x 4.7 x 6.7 cm. There is a posterior subserosal fibroid  which measures 2.2 x 2.6 x 2.8 cm. Endometrium Thickness: 1.7 cm. Heterogeneous echogenicity of the endometrium with irregular hypoechoic area within the fundus. Right ovary Measurements: 5.2 x 2.9 x 2.7 cm. There is a complex cystic mass which measures 3.0 x 1.7 x 2.0 cm. Left ovary Measurements: 3.9 x 2.6 x 3.6 cm. Normal appearance/no adnexal mass. There is a 3.1 x 2.4 x 2.5 cm simple appearing cyst. Pulsed Doppler evaluation of both ovaries demonstrates normal low-resistance arterial and venous waveforms. Other findings No abnormal free fluid. IMPRESSION: Slightly enlarged uterus with heterogeneous appearance of the endometrium containing irregular hypoechoic area within the fundus. 2.8 cm subserosal fibroid. 3 cm complex cystic structure on the right ovary. Patient's urine pregnancy test is reportedly negative. However, given the slightly enlarged uterus with heterogeneous appearing endometrium, and complex cystic structure on the right ovary, negative serum beta HCG should be established, as this constellation  of findings may be seen with early ectopic pregnancy. Alternatively, the right ovarian structure may represent a degenerating corpus luteum. If the patient is not pregnant, short-term follow-up to assure resolution of the endometrial heterogeneity versus an OBGYN consultation is recommended. Simple appearing 3.1 cm left ovarian cyst. Electronically Signed   By: Fidela Salisbury M.D.   On: 09/28/2016 19:41   US Pelvis Complete  Result Date: 09/28/2016 CLINICAL DATA:  Left lower quadrant abdominal pain. EXAM: TRANSABDOMINAL AND TRANSVAGINAL ULTRASOUND OF PELVIS DOPPLER ULTRASOUND OF OVARIES TECHNIQUE: Both transabdominal and transvaginal ultrasound examinations of the pelvis were performed. Transabdominal technique was performed for global imaging of the pelvis including uterus, ovaries, adnexal regions, and pelvic cul-de-sac. It was necessary to proceed with endovaginal exam following the  transabdominal exam to visualize the adnexa. Color and duplex Doppler ultrasound was utilized to evaluate blood flow to the ovaries. COMPARISON:  None. FINDINGS: Uterus Measurements: 10.1 x 4.7 x 6.7 cm. There is a posterior subserosal fibroid which measures 2.2 x 2.6 x 2.8 cm. Endometrium Thickness: 1.7 cm. Heterogeneous echogenicity of the endometrium with irregular hypoechoic area within the fundus. Right ovary Measurements: 5.2 x 2.9 x 2.7 cm. There is a complex cystic mass which measures 3.0 x 1.7 x 2.0 cm. Left ovary Measurements: 3.9 x 2.6 x 3.6 cm. Normal appearance/no adnexal mass. There is a 3.1 x 2.4 x 2.5 cm simple appearing cyst. Pulsed Doppler evaluation of both ovaries demonstrates normal low-resistance arterial and venous waveforms. Other findings No abnormal free fluid. IMPRESSION: Slightly enlarged uterus with heterogeneous appearance of the endometrium containing irregular hypoechoic area within the fundus. 2.8 cm subserosal fibroid. 3 cm complex cystic structure on the right ovary. Patient's urine pregnancy test is reportedly negative. However, given the slightly enlarged uterus with heterogeneous appearing endometrium, and complex cystic structure on the right ovary, negative serum beta HCG should be established, as this constellation of findings may be seen with early ectopic pregnancy. Alternatively, the right ovarian structure may represent a degenerating corpus luteum. If the patient is not pregnant, short-term follow-up to assure resolution of the endometrial heterogeneity versus an OBGYN consultation is recommended. Simple appearing 3.1 cm left ovarian cyst. Electronically Signed   By: Fidela Salisbury M.D.   On: 09/28/2016 19:41   [place] Art/ven Flow Abd Pelv Doppler  Result Date: 09/28/2016 CLINICAL DATA:  Left lower quadrant abdominal pain. EXAM: TRANSABDOMINAL AND TRANSVAGINAL ULTRASOUND OF PELVIS DOPPLER ULTRASOUND OF OVARIES TECHNIQUE: Both transabdominal and transvaginal  ultrasound examinations of the pelvis were performed. Transabdominal technique was performed for global imaging of the pelvis including uterus, ovaries, adnexal regions, and pelvic cul-de-sac. It was necessary to proceed with endovaginal exam following the transabdominal exam to visualize the adnexa. Color and duplex Doppler ultrasound was utilized to evaluate blood flow to the ovaries. COMPARISON:  None. FINDINGS: Uterus Measurements: 10.1 x 4.7 x 6.7 cm. There is a posterior subserosal fibroid which measures 2.2 x 2.6 x 2.8 cm. Endometrium Thickness: 1.7 cm. Heterogeneous echogenicity of the endometrium with irregular hypoechoic area within the fundus. Right ovary Measurements: 5.2 x 2.9 x 2.7 cm. There is a complex cystic mass which measures 3.0 x 1.7 x 2.0 cm. Left ovary Measurements: 3.9 x 2.6 x 3.6 cm. Normal appearance/no adnexal mass. There is a 3.1 x 2.4 x 2.5 cm simple appearing cyst. Pulsed Doppler evaluation of both ovaries demonstrates normal low-resistance arterial and venous waveforms. Other findings No abnormal free fluid. IMPRESSION: Slightly enlarged uterus with heterogeneous appearance of the endometrium containing irregular  hypoechoic area within the fundus. 2.8 cm subserosal fibroid. 3 cm complex cystic structure on the right ovary. Patient's urine pregnancy test is reportedly negative. However, given the slightly enlarged uterus with heterogeneous appearing endometrium, and complex cystic structure on the right ovary, negative serum beta HCG should be established, as this constellation of findings may be seen with early ectopic pregnancy. Alternatively, the right ovarian structure may represent a degenerating corpus luteum. If the patient is not pregnant, short-term follow-up to assure resolution of the endometrial heterogeneity versus an OBGYN consultation is recommended. Simple appearing 3.1 cm left ovarian cyst. Electronically Signed   By: Fidela Salisbury M.D.   On: 09/28/2016 19:41     MDM UPT negative VSS CBC U/a repeated d/t contaminated specimen -- 2nd specimen negative  S/w Dr. Sabra Heck. Will order pelvic ultrasound & possibly renal ultrasound if negative Ultrasound shows bilateral ovarian cysts & uterine fibroid. Radiologist recommends BHCG. See ultrasound report for full notes.  BHCG <1 Toradol 60 mg IM -- pt reports improvement in symptoms & requesting to be discharged home.  Discussed results with Dr. Sabra Heck who reviewed labs & images. Ok to discharge home. Pt to f/u with gyn.  Assessment and Plan  A; 1. LLQ pain   2. Left ovarian cyst   3. Subserous leiomyoma of uterus   4. Bilateral ovarian cysts   5. Pregnancy examination or test, negative result    P: Discharge home Discussed use of OTC meds for discomfort Call [facility] gynecology tomorrow for follow up If symptoms worsen or fever develops, go to [place] 09/28/2016, 5:54 PM

## 2016-09-28 NOTE — Discharge Instructions (Signed)
Uterine Fibroids Uterine fibroids are tissue masses (tumors) that can develop in the womb (uterus). They are also called leiomyomas. This type of tumor is not cancerous (benign) and does not spread to other parts of the body outside of the pelvic area, which is between the hip bones. Occasionally, fibroids may develop in the fallopian tubes, in the cervix, or on the support structures (ligaments) that surround the uterus. You can have one or many fibroids. Fibroids can vary in size, weight, and where they grow in the uterus. Some can become quite large. Most fibroids do not require medical treatment. What are the causes? A fibroid can develop when a single uterine cell keeps growing (replicating). Most cells in the human body have a control mechanism that keeps them from replicating without control. What are the signs or symptoms? Symptoms may include:  Heavy bleeding during your period.  Bleeding or spotting between periods.  Pelvic pain and pressure.  Bladder problems, such as needing to urinate more often (urinary frequency) or urgently.  Inability to reproduce offspring (infertility).  Miscarriages. How is this diagnosed? Uterine fibroids are diagnosed through a physical exam. Your health care provider may feel the lumpy tumors during a pelvic exam. Ultrasonography and an MRI may be done to determine the size, location, and number of fibroids. How is this treated? Treatment may include:  Watchful waiting. This involves getting the fibroid checked by your health care provider to see if it grows or shrinks. Follow your health care provider's recommendations for how often to have this checked.  Hormone medicines. These can be taken by mouth or given through an intrauterine device (IUD).  Surgery.  Removing the fibroids (myomectomy) or the uterus (hysterectomy).  Removing blood supply to the fibroids (uterine artery embolization). If fibroids interfere with your fertility and you  want to become pregnant, your health care provider may recommend having the fibroids removed. Follow these instructions at home:  Keep all follow-up visits as directed by your health care provider. This is important.  Take over-the-counter and prescription medicines only as told by your health care provider.  If you were prescribed a hormone treatment, take the hormone medicines exactly as directed.  Ask your health care provider about taking iron pills and increasing the amount of dark green, leafy vegetables in your diet. These actions can help to boost your blood iron levels, which may be affected by heavy menstrual bleeding.  Pay close attention to your period and tell your health care provider about any changes, such as:  Increased blood flow that requires you to use more pads or tampons than usual per month.  A change in the number of days that your period lasts per month.  A change in symptoms that are associated with your period, such as abdominal cramping or back pain. Contact a health care provider if:  You have pelvic pain, back pain, or abdominal cramps that cannot be controlled with medicines.  You have an increase in bleeding between and during periods.  You soak tampons or pads in a half hour or less.  You feel lightheaded, extra tired, or weak. Get help right away if:  You faint.  You have a sudden increase in pelvic pain. This information is not intended to replace advice given to you by your health care provider. Make sure you discuss any questions you have with your health care provider. Document Released: 05/16/2000 Document Revised: 01/17/2016 Document Reviewed: 11/15/2013 Elsevier Interactive Patient Education  2017 [employer] [place]. Ovarian Cyst  An ovarian cyst is a fluid-filled sac that forms on an ovary. The ovaries are small organs that produce eggs in women. Various types of cysts can form on the ovaries. Some may cause symptoms and require treatment. Most  ovarian cysts go away on their own, are not cancerous (are benign), and do not cause problems. Common types of ovarian cysts include:  Functional (follicle) cysts.  Occur during the menstrual cycle, and usually go away with the next menstrual cycle if you do not get pregnant.  Usually cause no symptoms.  Endometriomas.  Are cysts that form from the tissue that lines the uterus (endometrium).  Are sometimes called chocolate cysts because they become filled with blood that turns brown.  Can cause pain in the lower abdomen during intercourse and during your period.  Cystadenoma cysts.  Develop from cells on the outside surface of the ovary.  Can get very large and cause lower abdomen pain and pain with intercourse.  Can cause severe pain if they twist or break open (rupture).  Dermoid cysts.  Are sometimes found in both ovaries.  May contain different kinds of body tissue, such as skin, teeth, hair, or cartilage.  Usually do not cause symptoms unless they get very big.  Theca lutein cysts.  Occur when too much of a certain hormone (human chorionic gonadotropin) is produced and overstimulates the ovaries to produce an egg.  Are most common after having procedures used to assist with the conception of a baby (in vitro fertilization). What are the causes? Ovarian cysts may be caused by:  Ovarian hyperstimulation syndrome. This is a condition that can develop from taking fertility medicines. It causes multiple large ovarian cysts to form.  Polycystic ovarian syndrome (PCOS). This is a common hormonal disorder that can cause ovarian cysts, as well as problems with your period or fertility. What increases the risk? The following factors may make you more likely to develop ovarian cysts:  Being overweight or obese.  Taking fertility medicines.  Taking certain forms of hormonal birth control.  Smoking. What are the signs or symptoms? Many ovarian cysts do not cause  symptoms. If symptoms are present, they may include:  Pelvic pain or pressure.  Pain in the lower abdomen.  Pain during sex.  Abdominal swelling.  Abnormal menstrual periods.  Increasing pain with menstrual periods. How is this diagnosed? These cysts are commonly found during a routine pelvic exam. You may have tests to find out more about the cyst, such as:  Ultrasound.  X-ray of the pelvis.  CT scan.  MRI.  Blood tests. How is this treated? Many ovarian cysts go away on their own without treatment. Your health care provider may want to check your cyst regularly for 2-3 months to see if it changes. If you are in menopause, it is especially important to have your cyst monitored closely because menopausal women have a higher rate of ovarian cancer. When treatment is needed, it may include:  Medicines to help relieve pain.  A procedure to drain the cyst (aspiration).  Surgery to remove the whole cyst.  Hormone treatment or birth control pills. These methods are sometimes used to help dissolve a cyst. Follow these instructions at home:  Take over-the-counter and prescription medicines only as told by your health care provider.  Do not drive or use heavy machinery while taking prescription pain medicine.  Get regular pelvic exams and Pap tests as often as told by your health care provider.  Return to your normal activities  as told by your health care provider. Ask your health care provider what activities are safe for you.  Do not use any products that contain nicotine or tobacco, such as cigarettes and e-cigarettes. If you need help quitting, ask your health care provider.  Keep all follow-up visits as told by your health care provider. This is important. Contact a health care provider if:  Your periods are late, irregular, or painful, or they stop.  You have pelvic pain that does not go away.  You have pressure on your bladder or trouble emptying your bladder  completely.  You have pain during sex.  You have any of the following in your abdomen:  A feeling of fullness.  Pressure.  Discomfort.  Pain that does not go away.  Swelling.  You feel generally ill.  You become constipated.  You lose your appetite.  You develop severe acne.  You start to have more body hair and facial hair.  You are gaining weight or losing weight without changing your exercise and eating habits.  You think you may be pregnant. Get help right away if:  You have abdominal pain that is severe or gets worse.  You cannot eat or drink without vomiting.  You suddenly develop a fever.  Your menstrual period is much heavier than usual. This information is not intended to replace advice given to you by your health care provider. Make sure you discuss any questions you have with your health care provider. Document Released: 05/19/2005 Document Revised: 12/07/2015 Document Reviewed: 10/21/2015 Elsevier Interactive Patient Education  2017 [employer].

## 2016-09-29 ENCOUNTER — Ambulatory Visit (INDEPENDENT_AMBULATORY_CARE_PROVIDER_SITE_OTHER): Payer: BLUE CROSS/BLUE SHIELD | Admitting: Gynecology

## 2016-09-29 ENCOUNTER — Emergency Department (HOSPITAL_COMMUNITY)
Admission: EM | Admit: 2016-09-29 | Discharge: 2016-09-29 | Disposition: A | Discharge status: Home / Self Care | Payer: BLUE CROSS/BLUE SHIELD | Attending: Emergency Medicine | Admitting: Emergency Medicine

## 2016-09-29 ENCOUNTER — Encounter: Payer: Self-pay | Admitting: Gynecology

## 2016-09-29 ENCOUNTER — Encounter (HOSPITAL_COMMUNITY): Payer: Self-pay

## 2016-09-29 VITALS — BP 98/60 | Ht 65.75 in | Wt 120.8 lb

## 2016-09-29 DIAGNOSIS — D251 Intramural leiomyoma of uterus: Secondary | ICD-10-CM | POA: Diagnosis not present

## 2016-09-29 DIAGNOSIS — D219 Benign neoplasm of connective and other soft tissue, unspecified: Secondary | ICD-10-CM

## 2016-09-29 DIAGNOSIS — R102 Pelvic and perineal pain unspecified side: Secondary | ICD-10-CM

## 2016-09-29 DIAGNOSIS — D259 Leiomyoma of uterus, unspecified: Secondary | ICD-10-CM | POA: Insufficient documentation

## 2016-09-29 DIAGNOSIS — N83201 Unspecified ovarian cyst, right side: Secondary | ICD-10-CM | POA: Diagnosis not present

## 2016-09-29 DIAGNOSIS — R1030 Lower abdominal pain, unspecified: Secondary | ICD-10-CM | POA: Diagnosis present

## 2016-09-29 DIAGNOSIS — N83202 Unspecified ovarian cyst, left side: Secondary | ICD-10-CM | POA: Insufficient documentation

## 2016-09-29 LAB — URINALYSIS, ROUTINE W REFLEX MICROSCOPIC
BILIRUBIN URINE: NEGATIVE
GLUCOSE, UA: NEGATIVE mg/dL
HGB URINE DIPSTICK: NEGATIVE
KETONES UR: NEGATIVE mg/dL
LEUKOCYTES UA: NEGATIVE
Nitrite: NEGATIVE
PH: 5 (ref 5.0–8.0)
PROTEIN: NEGATIVE mg/dL
Specific Gravity, Urine: 1.02 (ref 1.005–1.030)

## 2016-09-29 LAB — BASIC METABOLIC PANEL
Anion gap: 6 (ref 5–15)
BUN: 17 mg/dL (ref 6–20)
CALCIUM: 9.4 mg/dL (ref 8.9–10.3)
CO2: 26 mmol/L (ref 22–32)
CREATININE: 0.82 mg/dL (ref 0.44–1.00)
Chloride: 104 mmol/L (ref 101–111)
GFR calc Af Amer: >60 mL/min (ref >60)
GLUCOSE: 110 mg/dL — AB (ref 65–99)
Potassium: 3.5 mmol/L (ref 3.5–5.1)
Sodium: 136 mmol/L (ref 135–145)

## 2016-09-29 LAB — CBC
HEMATOCRIT: 38.3 % (ref 36.0–46.0)
Hemoglobin: 13.2 g/dL (ref 12.0–15.0)
MCH: 31 pg (ref 26.0–34.0)
MCHC: 34.5 g/dL (ref 30.0–36.0)
MCV: 89.9 fL (ref 78.0–100.0)
PLATELETS: 313 10*3/uL (ref 150–400)
RBC: 4.26 MIL/uL (ref 3.87–5.11)
RDW: 12.3 % (ref 11.5–15.5)
WBC: 8.7 10*3/uL (ref 4.0–10.5)

## 2016-09-29 LAB — PREGNANCY, URINE: Preg Test, Ur: NEGATIVE

## 2016-09-29 MED ORDER — KETOROLAC TROMETHAMINE 30 MG/ML IJ SOLN
30.0000 mg | Freq: Once | INTRAMUSCULAR | Status: AC
  Administered 2016-09-29: 30 mg via INTRAVENOUS
  Filled 2016-09-29: qty 1

## 2016-09-29 MED ORDER — KETOROLAC TROMETHAMINE 60 MG/2ML IM SOLN
60.0000 mg | Freq: Once | INTRAMUSCULAR | Status: AC
  Administered 2016-09-29: 60 mg via INTRAMUSCULAR

## 2016-09-29 MED ORDER — MEDROXYPROGESTERONE ACETATE 150 MG/ML IM SUSP
150.0000 mg | Freq: Once | INTRAMUSCULAR | Status: AC
  Administered 2016-09-29: 150 mg via INTRAMUSCULAR

## 2016-09-29 MED ORDER — IBUPROFEN 600 MG PO TABS: 600.0000 mg | ORAL_TABLET | Freq: Four times a day (QID) | ORAL | 0 refills | Status: DC | PRN

## 2016-09-29 MED ORDER — OXYCODONE-ACETAMINOPHEN 5-325 MG PO TABS
1.0000 | ORAL_TABLET | Freq: Once | ORAL | Status: AC
  Administered 2016-09-29: 1 via ORAL
  Filled 2016-09-29: qty 1

## 2016-09-29 MED ORDER — ONDANSETRON HCL 4 MG PO TABS: 4.0000 mg | ORAL_TABLET | Freq: Four times a day (QID) | ORAL | 0 refills | Status: DC

## 2016-09-29 MED ORDER — KETOROLAC TROMETHAMINE 10 MG PO TABS: 10.0000 mg | ORAL_TABLET | Freq: Four times a day (QID) | ORAL | 0 refills | Status: DC | PRN

## 2016-09-29 MED ORDER — ONDANSETRON HCL 4 MG/2ML IJ SOLN
4.0000 mg | Freq: Once | INTRAMUSCULAR | Status: AC
  Administered 2016-09-29: 4 mg via INTRAVENOUS
  Filled 2016-09-29: qty 2

## 2016-09-29 MED ORDER — SODIUM CHLORIDE 0.9 % IV BOLUS (SEPSIS)
1000.0000 mL | Freq: Once | INTRAVENOUS | Status: AC
  Administered 2016-09-29: 1000 mL via INTRAVENOUS

## 2016-09-29 NOTE — ED Triage Notes (Signed)
States for about 2 days nausea vomiting and abdominal pain [facility] came here now for worsening pain and vomiting with dysuria.

## 2016-09-29 NOTE — Patient Instructions (Addendum)
Ketorolac injection What is this medicine? KETOROLAC (kee toe ROLE ak) is a non-steroidal anti-inflammatory drug (NSAID). It is used to treat moderate to severe pain for up to 5 days. It is commonly used after surgery. This medicine should not be used for more than 5 days. This medicine may be used for other purposes; ask your health care provider or pharmacist if you have questions. COMMON BRAND NAME(S): Toradol What should I tell my health care provider before I take this medicine? They need to know if you have any of these conditions: -asthma, especially aspirin-sensitive asthma -bleeding problems -kidney disease -stomach bleed, ulcer, or other problem -taking aspirin, other NSAID, or probenecid -an unusual or allergic reaction to ketorolac, tromethamine, aspirin, other NSAIDs, other medicines, foods, dyes or preservatives -pregnant or trying to get pregnant -breast-feeding How should I use this medicine? This medicine is for injection into a muscle or into a vein. It is given by a health care professional in a hospital or clinic setting. Talk to your pediatrician regarding the use of this medicine in children. Special care may be needed. Patients over 49 years old may have a stronger reaction and need a smaller dose. Overdosage: If you think you have taken too much of this medicine contact a poison control center or emergency room at once. NOTE: This medicine is only for you. Do not share this medicine with others. What if I miss a dose? This does not apply. What may interact with this medicine? Do not take this medicine with any of the following medications: -aspirin and aspirin-like medicines -cidofovir -methotrexate -NSAIDs, medicines for pain and inflammation, like ibuprofen or naproxen -pentoxifylline -probenecid This medicine may also interact with the following  medications: -alcohol -alendronate -alprazolam -carbamazepine -diuretics -flavocoxid -fluoxetine -ginkgo -lithium -medicines for blood pressure like enalapril -medicines that affect platelets like pentoxifylline -medicines that treat or prevent blood clots like heparin, warfarin -muscle relaxants -pemetrexed -phenytoin -thiothixene This list may not describe all possible interactions. Give your health care provider a list of all the medicines, herbs, non-prescription drugs, or dietary supplements you use. Also tell them if you smoke, drink alcohol, or use illegal drugs. Some items may interact with your medicine. What should I watch for while using this medicine? Tell your doctor or healthcare professional if your symptoms do not start to get better or if they get worse. This medicine does not prevent heart attack or stroke. In fact, this medicine may increase the chance of a heart attack or stroke. The chance may increase with longer use of this medicine and in people who have heart disease. If you take aspirin to prevent heart attack or stroke, talk with your doctor or health care professional. Do not take medicines such as ibuprofen and naproxen with this medicine. Side effects such as stomach upset, nausea, or ulcers may be more likely to occur. Many medicines available without a prescription should not be taken with this medicine. This medicine can cause ulcers and bleeding in the stomach and intestines at any time during treatment. Do not smoke cigarettes or drink alcohol. These increase irritation to your stomach and can make it more susceptible to damage from this medicine. Ulcers and bleeding can happen without warning symptoms and can cause death. This medicine can cause you to bleed more easily. Try to avoid damage to your teeth and gums when you brush or floss your teeth. What side effects may I notice from receiving this medicine? Side effects that you should report to your  doctor or health care professional as soon as possible: -allergic reactions like skin rash, itching or hives, swelling of the face, lips, or tongue -breathing problems -high blood pressure -nausea, vomiting -redness, blistering, peeling or loosening of the skin, including inside the mouth -severe stomach pain -signs and symptoms of bleeding such as bloody or black, tarry stools; red or dark-brown urine; spitting up blood or brown material that looks like coffee grounds; red spots on the skin; unusual bruising or bleeding from the eye, gums, or nose -signs and symptoms of a blood clot changes in vision; chest pain; severe, sudden headache; trouble speaking; sudden numbness or weakness of the face, arm, or leg -trouble passing urine or change in the amount of urine -unexplained weight gain or swelling -unusually weak or tired -yellowing of eyes or skin Side effects that usually do not require medical attention (report to your doctor or health care professional if they continue or are bothersome): -diarrhea -dizziness -headache -heartburn This list may not describe all possible side effects. Call your doctor for medical advice about side effects. You may report side effects to FDA at 1-800-FDA-1088. Where should I keep my medicine? This drug is given in a hospital or clinic and will not be stored at home. NOTE: This sheet is a summary. It may not cover all possible information. If you have questions about this medicine, talk to your doctor, pharmacist, or health care provider.  2018 Elsevier/Gold Standard (2016-05-28 14:38:40) Ovarian Cyst  An ovarian cyst is a fluid-filled sac that forms on an ovary. The ovaries are small organs that produce eggs in women. Various types of cysts can form on the ovaries. Some may cause symptoms and require treatment. Most ovarian cysts go away on their own, are not cancerous (are benign), and do not cause problems. Common types of ovarian cysts  include:  Functional (follicle) cysts.  Occur during the menstrual cycle, and usually go away with the next menstrual cycle if you do not get pregnant.  Usually cause no symptoms.  Endometriomas.  Are cysts that form from the tissue that lines the uterus (endometrium).  Are sometimes called "chocolate cysts" because they become filled with blood that turns brown.  Can cause pain in the lower abdomen during intercourse and during your period.  Cystadenoma cysts.  Develop from cells on the outside surface of the ovary.  Can get very large and cause lower abdomen pain and pain with intercourse.  Can cause severe pain if they twist or break open (rupture).  Dermoid cysts.  Are sometimes found in both ovaries.  May contain different kinds of body tissue, such as skin, teeth, hair, or cartilage.  Usually do not cause symptoms unless they get very big.  Theca lutein cysts.  Occur when too much of a certain hormone (human chorionic gonadotropin) is produced and overstimulates the ovaries to produce an egg.  Are most common after having procedures used to assist with the conception of a baby (in vitro fertilization). What are the causes? Ovarian cysts may be caused by:  Ovarian hyperstimulation syndrome. This is a condition that can develop from taking fertility medicines. It causes multiple large ovarian cysts to form.  Polycystic ovarian syndrome (PCOS). This is a common hormonal disorder that can cause ovarian cysts, as well as problems with your period or fertility. What increases the risk? The following factors may make you more likely to develop ovarian cysts:  Being overweight or obese.  Taking fertility medicines.  Taking certain forms of hormonal  birth control.  Smoking. What are the signs or symptoms? Many ovarian cysts do not cause symptoms. If symptoms are present, they may include:  Pelvic pain or pressure.  Pain in the lower abdomen.  Pain during  sex.  Abdominal swelling.  Abnormal menstrual periods.  Increasing pain with menstrual periods. How is this diagnosed? These cysts are commonly found during a routine pelvic exam. You may have tests to find out more about the cyst, such as:  Ultrasound.  X-ray of the pelvis.  CT scan.  MRI.  Blood tests. How is this treated? Many ovarian cysts go away on their own without treatment. Your health care provider may want to check your cyst regularly for 2-3 months to see if it changes. If you are in menopause, it is especially important to have your cyst monitored closely because menopausal women have a higher rate of ovarian cancer. When treatment is needed, it may include:  Medicines to help relieve pain.  A procedure to drain the cyst (aspiration).  Surgery to remove the whole cyst.  Hormone treatment or birth control pills. These methods are sometimes used to help dissolve a cyst. Follow these instructions at home:  Take over-the-counter and prescription medicines only as told by your health care provider.  Do not drive or use heavy machinery while taking prescription pain medicine.  Get regular pelvic exams and Pap tests as often as told by your health care provider.  Return to your normal activities as told by your health care provider. Ask your health care provider what activities are safe for you.  Do not use any products that contain nicotine or tobacco, such as cigarettes and e-cigarettes. If you need help quitting, ask your health care provider.  Keep all follow-up visits as told by your health care provider. This is important. Contact a health care provider if:  Your periods are late, irregular, or painful, or they stop.  You have pelvic pain that does not go away.  You have pressure on your bladder or trouble emptying your bladder completely.  You have pain during sex.  You have any of the following in your abdomen:  A feeling of  fullness.  Pressure.  Discomfort.  Pain that does not go away.  Swelling.  You feel generally ill.  You become constipated.  You lose your appetite.  You develop severe acne.  You start to have more body hair and facial hair.  You are gaining weight or losing weight without changing your exercise and eating habits.  You think you may be pregnant. Get help right away if:  You have abdominal pain that is severe or gets worse.  You cannot eat or drink without vomiting.  You suddenly develop a fever.  Your menstrual period is much heavier than usual. This information is not intended to replace advice given to you by your health care provider. Make sure you discuss any questions you have with your health care provider. Document Released: 05/19/2005 Document Revised: 12/07/2015 Document Reviewed: 10/21/2015 Elsevier Interactive Patient Education  2017 [employer].   Endometriosis Endometriosis is a condition in which the tissue that lines the uterus (endometrium) grows outside of its normal location. The tissue may grow in many locations close to the uterus, but it commonly grows on the ovaries, fallopian tubes, vagina, or bowel. When the uterus sheds the endometrium every menstrual cycle, there is bleeding wherever the endometrial tissue is located. This can cause pain because blood is irritating to tissues that are not  normally exposed to it. What are the causes? The cause of endometriosis is not known. What increases the risk? You may be more likely to develop endometriosis if you:  Have a family history of endometriosis.  Have never given birth.  Started your period at age 72 or younger.  Have high levels of estrogen in your body.  Were exposed to a certain medicine (diethylstilbestrol) before you were born (in utero).  Had low birth weight.  Were born as a twin, triplet, or other multiple.  Have a BMI of less than 25. BMI is an estimate of body fat and is  calculated from height and weight. What are the signs or symptoms? Often, there are no symptoms of this condition. If you do have symptoms, they may:  Vary depending on where your endometrial tissue is growing.  Occur during your menstrual period (most common) or midcycle.  Come and go, or you may go months with no symptoms at all.  Stop with menopause. Symptoms may include:  Pain in the back or abdomen.  Heavier bleeding during periods.  Pain during sex.  Painful bowel movements.  Infertility.  Pelvic pain.  Bleeding more than once a month. How is this diagnosed? This condition is diagnosed based on your symptoms and a physical exam. You may have tests, such as:  Blood tests and urine tests. These may be done to help rule out other possible causes of your symptoms.  Ultrasound, to look for abnormal tissues.  An X-ray of the lower bowel (barium enema).  An ultrasound that is done through the vagina (transvaginally).  CT scan.  MRI.  Laparoscopy. In this procedure, a lighted, pencil-sized instrument called a laparoscope is inserted into your abdomen through an incision. The laparoscope allows your health care provider to look at the organs inside your body and check for abnormal tissue to confirm the diagnosis. If abnormal tissue is found, your health care provider may remove a small piece of tissue (biopsy) to be examined under a microscope. How is this treated? Treatment for this condition may include:  Medicines to relieve pain, such as NSAIDs.  Hormone therapy. This involves using artificial (synthetic) hormones to reduce endometrial tissue growth. Your health care provider may recommend using a hormonal form of birth control, or other medicines.  Surgery. This may be done to remove abnormal endometrial tissue.  In some cases, tissue may be removed using a laparoscope and a laser (laparoscopic laser treatment).  In severe cases, surgery may be done to remove  the fallopian tubes, uterus, and ovaries (hysterectomy). Follow these instructions at home:  Take over-the-counter and prescription medicines only as told by your health care provider.  Do not drive or use heavy machinery while taking prescription pain medicine.  Try to avoid activities that cause pain, including sexual activity.  Keep all follow-up visits as told by your health care provider. This is important. Contact a health care provider if:  You have pain in the area between your hip bones (pelvic area) that occurs:  Before, during, or after your period.  In between your period and gets worse during your period.  During or after sex.  With bowel movements or urination, especially during your period.  You have problems getting pregnant.  You have a fever. Get help right away if:  You have severe pain that does not get better with medicine.  You have severe nausea and vomiting, or you cannot eat without vomiting.  You have pain that affects only the  lower, right side of your abdomen.  You have abdominal pain that gets worse.  You have abdominal swelling.  You have blood in your stool. This information is not intended to replace advice given to you by your health care provider. Make sure you discuss any questions you have with your health care provider. Document Released: 05/16/2000 Document Revised: 02/22/2016 Document Reviewed: 10/20/2015 Elsevier Interactive Patient Education  2017 [employer].

## 2016-09-29 NOTE — Discharge Instructions (Signed)
Please read and follow all provided instructions.  Your diagnoses today include:  1. Leiomyoma   2. Bilateral ovarian cysts   3. Suprapubic pain     Tests performed today include: Vital signs. See below for your results today.   Medications prescribed:  Take as prescribed   Home care instructions:  Follow any educational materials contained in this packet.  Follow-up instructions: Please follow-up with Gynecology for further evaluation of symptoms and treatment   Return instructions:  Please return to the Emergency Department if you do not get better, if you get worse, or new symptoms OR  - Fever (temperature greater than 101.25F)  - Bleeding that does not stop with holding pressure to the area    -Severe pain (please note that you may be more sore the day after your accident)  - Chest Pain  - Difficulty breathing  - Severe nausea or vomiting  - Inability to tolerate food and liquids  - Passing out  - Skin becoming red around your wounds  - Change in mental status (confusion or lethargy)  - New numbness or weakness    Please return if you have any other emergent concerns.  Additional Information:  Your vital signs today were: BP 120/74    Pulse 62    Temp 97.5 F (36.4 C) (Oral)    Resp 18    Wt 54 kg    SpO2 100%    BMI 19.21 kg/m  If your blood pressure (BP) was elevated above 135/85 this visit, please have this repeated by your doctor within one month. ---------------

## 2016-09-29 NOTE — Progress Notes (Signed)
   Patient is a 31 year old gravida 0 who presented today for follow-up after having been seen at [facility] hospital and [facility]  ER over the past couple of days as a result of low abdominal discomfort and nausea. She denied any vaginal discharge no fever, chills. I have reviewed her ultrasound done on April 29 demonstrated the following:  FINDINGS: Uterus  Measurements: 10.1 x 4.7 x 6.7 cm. There is a posterior subserosal fibroid which measures 2.2 x 2.6 x 2.8 cm.  Endometrium  Thickness: 1.7 cm. Heterogeneous echogenicity of the endometrium with irregular hypoechoic area within the fundus.  Right ovary  Measurements: 5.2 x 2.9 x 2.7 cm. There is a complex cystic mass which measures 3.0 x 1.7 x 2.0 cm.  Left ovary  Measurements: 3.9 x 2.6 x 3.6 cm. Normal appearance/no adnexal mass. There is a 3.1 x 2.4 x 2.5 cm simple appearing cyst.  Pulsed Doppler evaluation of both ovaries demonstrates normal low-resistance arterial and venous waveforms.  Other findings  No abnormal free fluid.  IMPRESSION: Slightly enlarged uterus with heterogeneous appearance of the endometrium containing irregular hypoechoic area within the fundus.  2.8 cm subserosal fibroid.  3 cm complex cystic structure on the right ovary.  Patient had a negative urine pregnancy test as well as a normal CBC and basic metabolic panel slightly elevated blood sugar 110 otherwise normal. Negative urinalysis  Patient stated she had had normal menstrual cycles no month of January February and March and April was one day only.  Exam: Back: No CVA tenderness Abdomen: Soft slightly tender left lower abdomen but no rebound or guarding Negative Rovsing, negative obturator, negative heel tap sign (past history of appendectomy many years ago) Pelvic: Bartholin urethra Skene was within normal limits Vagina: No lesions or discharge Cervix: No lesions or discharge Uterus: Upper limits of normal nontender Adnexa:  Slightly tender left greater than right but no rebound or guarding Rectal exam not done  Urine trace test was repeated today which was negative for  Assessment/plan: Patient and bilateral ovarian cyst possibly underlying endometriosis to negative urine pregnancy test will proceed with given her shot of Depo-Provera 150 mg and have the patient return back in 3 months for follow-up ultrasound. For the discomfort she'll receive Toradol 60 mg IM today with a prescription for 10 mg to take 1 by mouth every 6 hours when necessary for the next 3-5 days. Literature information on ovarian cysts and endometriosis was provided.

## 2016-09-29 NOTE — ED Provider Notes (Signed)
[facility] DEPT Provider Note   CSN: [id number] Arrival date & time: 09/29/16  0229     History   Chief Complaint Chief Complaint  Patient presents with  . Abdominal Pain    HPI Alyssa Kaiser is a 31 y.o. female.  HPI  31 y.o. female, presents to the Emergency Department today complaining of N/V and suprapubic abdominal pain x 2 days. Seen at [facility] yesterday for same. Notes pain causes N/V. No diarrhea. Noted dysuria without hematuria. States pain is worse with BMs as well as positioning. MAU visit yesterday with transvaginal US done that showed bilateral ovarian cysts as well as uterine fibroid. Beta HCG negative. Husband states that they were told to come to main hospital ED if symptoms persisted to evaluate for other potential causes of discomfort. No CP/SOB. No fevers. NO chills. No flank pain. Noted Alleve 250mg  around 0200. Pain 8/10. Pressure sensation. Denies hematuria, vaginal discharge, vaginal bleeding. LMP 08-07-16. No other symptoms noted.    Past Medical History:  Diagnosis Date  . Depression     Patient Active Problem List   Diagnosis Date Noted  . Late menses 11/07/2014    Past Surgical History:  Procedure Laterality Date  . APPENDECTOMY      OB History    Gravida Para Term Preterm AB Living   0 0 0 0 0 0   SAB TAB Ectopic Multiple Live Births   0 0 0 0 0       Home Medications    Prior to Admission medications   Not on File    Family History Family History  Problem Relation Age of Onset  . Thyroid disease Mother     Social History Social History  Substance Use Topics  . Smoking status: Never Smoker  . Smokeless tobacco: Never Used  . Alcohol use No     Allergies   Other   Review of Systems Review of Systems ROS reviewed and all are negative for acute change except as noted in the HPI.  Physical Exam Updated Vital Signs BP 112/70 (BP Location: Left Arm)   Pulse 60   Temp 97.5 F (36.4 C) (Oral)   Resp 18   Wt  54 kg   SpO2 100%   BMI 19.21 kg/m   Physical Exam  Constitutional: She is oriented to person, place, and time. Vital signs are normal. She appears well-developed and well-nourished.  HENT:  Head: Normocephalic and atraumatic.  Right Ear: Hearing normal.  Left Ear: Hearing normal.  Eyes: Conjunctivae and EOM are normal. Pupils are equal, round, and reactive to light.  Neck: Normal range of motion. Neck supple.  Cardiovascular: Normal rate, regular rhythm, normal heart sounds and intact distal pulses.   Pulmonary/Chest: Effort normal and breath sounds normal. No respiratory distress. She has no wheezes. She has no rales. She exhibits no tenderness.  Abdominal: Soft. Normal appearance and bowel sounds are normal. There is tenderness in the suprapubic area. There is no rigidity, no rebound, no guarding, no CVA tenderness, no tenderness at McBurney's point and negative Murphy's sign.  Abdomen soft. TTP suprapubic region   Neurological: She is alert and oriented to person, place, and time.  Skin: Skin is warm and dry.  Psychiatric: She has a normal mood and affect. Her speech is normal and behavior is normal. Thought content normal.  Nursing note and vitals reviewed.    ED Treatments / Results  Labs (all labs ordered are listed, but only abnormal results are displayed) Labs  Reviewed  BASIC METABOLIC PANEL - Abnormal; Notable for the following:       Result Value   Glucose, Bld 110 (*)    All other components within normal limits  CBC  URINALYSIS, ROUTINE W REFLEX MICROSCOPIC    EKG  EKG Interpretation None       Radiology US Transvaginal Non-ob  Result Date: 09/28/2016 CLINICAL DATA:  Left lower quadrant abdominal pain. EXAM: TRANSABDOMINAL AND TRANSVAGINAL ULTRASOUND OF PELVIS DOPPLER ULTRASOUND OF OVARIES TECHNIQUE: Both transabdominal and transvaginal ultrasound examinations of the pelvis were performed. Transabdominal technique was performed for global imaging of the  pelvis including uterus, ovaries, adnexal regions, and pelvic cul-de-sac. It was necessary to proceed with endovaginal exam following the transabdominal exam to visualize the adnexa. Color and duplex Doppler ultrasound was utilized to evaluate blood flow to the ovaries. COMPARISON:  None. FINDINGS: Uterus Measurements: 10.1 x 4.7 x 6.7 cm. There is a posterior subserosal fibroid which measures 2.2 x 2.6 x 2.8 cm. Endometrium Thickness: 1.7 cm. Heterogeneous echogenicity of the endometrium with irregular hypoechoic area within the fundus. Right ovary Measurements: 5.2 x 2.9 x 2.7 cm. There is a complex cystic mass which measures 3.0 x 1.7 x 2.0 cm. Left ovary Measurements: 3.9 x 2.6 x 3.6 cm. Normal appearance/no adnexal mass. There is a 3.1 x 2.4 x 2.5 cm simple appearing cyst. Pulsed Doppler evaluation of both ovaries demonstrates normal low-resistance arterial and venous waveforms. Other findings No abnormal free fluid. IMPRESSION: Slightly enlarged uterus with heterogeneous appearance of the endometrium containing irregular hypoechoic area within the fundus. 2.8 cm subserosal fibroid. 3 cm complex cystic structure on the right ovary. Patient's urine pregnancy test is reportedly negative. However, given the slightly enlarged uterus with heterogeneous appearing endometrium, and complex cystic structure on the right ovary, negative serum beta HCG should be established, as this constellation of findings may be seen with early ectopic pregnancy. Alternatively, the right ovarian structure may represent a degenerating corpus luteum. If the patient is not pregnant, short-term follow-up to assure resolution of the endometrial heterogeneity versus an OBGYN consultation is recommended. Simple appearing 3.1 cm left ovarian cyst. Electronically Signed   By: Fidela Salisbury M.D.   On: 09/28/2016 19:41   US Pelvis Complete  Result Date: 09/28/2016 CLINICAL DATA:  Left lower quadrant abdominal pain. EXAM: TRANSABDOMINAL  AND TRANSVAGINAL ULTRASOUND OF PELVIS DOPPLER ULTRASOUND OF OVARIES TECHNIQUE: Both transabdominal and transvaginal ultrasound examinations of the pelvis were performed. Transabdominal technique was performed for global imaging of the pelvis including uterus, ovaries, adnexal regions, and pelvic cul-de-sac. It was necessary to proceed with endovaginal exam following the transabdominal exam to visualize the adnexa. Color and duplex Doppler ultrasound was utilized to evaluate blood flow to the ovaries. COMPARISON:  None. FINDINGS: Uterus Measurements: 10.1 x 4.7 x 6.7 cm. There is a posterior subserosal fibroid which measures 2.2 x 2.6 x 2.8 cm. Endometrium Thickness: 1.7 cm. Heterogeneous echogenicity of the endometrium with irregular hypoechoic area within the fundus. Right ovary Measurements: 5.2 x 2.9 x 2.7 cm. There is a complex cystic mass which measures 3.0 x 1.7 x 2.0 cm. Left ovary Measurements: 3.9 x 2.6 x 3.6 cm. Normal appearance/no adnexal mass. There is a 3.1 x 2.4 x 2.5 cm simple appearing cyst. Pulsed Doppler evaluation of both ovaries demonstrates normal low-resistance arterial and venous waveforms. Other findings No abnormal free fluid. IMPRESSION: Slightly enlarged uterus with heterogeneous appearance of the endometrium containing irregular hypoechoic area within the fundus. 2.8 cm subserosal fibroid. 3  cm complex cystic structure on the right ovary. Patient's urine pregnancy test is reportedly negative. However, given the slightly enlarged uterus with heterogeneous appearing endometrium, and complex cystic structure on the right ovary, negative serum beta HCG should be established, as this constellation of findings may be seen with early ectopic pregnancy. Alternatively, the right ovarian structure may represent a degenerating corpus luteum. If the patient is not pregnant, short-term follow-up to assure resolution of the endometrial heterogeneity versus an OBGYN consultation is recommended. Simple  appearing 3.1 cm left ovarian cyst. Electronically Signed   By: Fidela Salisbury M.D.   On: 09/28/2016 19:41   [place] Art/ven Flow Abd Pelv Doppler  Result Date: 09/28/2016 CLINICAL DATA:  Left lower quadrant abdominal pain. EXAM: TRANSABDOMINAL AND TRANSVAGINAL ULTRASOUND OF PELVIS DOPPLER ULTRASOUND OF OVARIES TECHNIQUE: Both transabdominal and transvaginal ultrasound examinations of the pelvis were performed. Transabdominal technique was performed for global imaging of the pelvis including uterus, ovaries, adnexal regions, and pelvic cul-de-sac. It was necessary to proceed with endovaginal exam following the transabdominal exam to visualize the adnexa. Color and duplex Doppler ultrasound was utilized to evaluate blood flow to the ovaries. COMPARISON:  None. FINDINGS: Uterus Measurements: 10.1 x 4.7 x 6.7 cm. There is a posterior subserosal fibroid which measures 2.2 x 2.6 x 2.8 cm. Endometrium Thickness: 1.7 cm. Heterogeneous echogenicity of the endometrium with irregular hypoechoic area within the fundus. Right ovary Measurements: 5.2 x 2.9 x 2.7 cm. There is a complex cystic mass which measures 3.0 x 1.7 x 2.0 cm. Left ovary Measurements: 3.9 x 2.6 x 3.6 cm. Normal appearance/no adnexal mass. There is a 3.1 x 2.4 x 2.5 cm simple appearing cyst. Pulsed Doppler evaluation of both ovaries demonstrates normal low-resistance arterial and venous waveforms. Other findings No abnormal free fluid. IMPRESSION: Slightly enlarged uterus with heterogeneous appearance of the endometrium containing irregular hypoechoic area within the fundus. 2.8 cm subserosal fibroid. 3 cm complex cystic structure on the right ovary. Patient's urine pregnancy test is reportedly negative. However, given the slightly enlarged uterus with heterogeneous appearing endometrium, and complex cystic structure on the right ovary, negative serum beta HCG should be established, as this constellation of findings may be seen with early ectopic  pregnancy. Alternatively, the right ovarian structure may represent a degenerating corpus luteum. If the patient is not pregnant, short-term follow-up to assure resolution of the endometrial heterogeneity versus an OBGYN consultation is recommended. Simple appearing 3.1 cm left ovarian cyst. Electronically Signed   By: Fidela Salisbury M.D.   On: 09/28/2016 19:41    Procedures Procedures (including critical care time)  Medications Ordered in ED Medications - No data to display   Initial Impression / Assessment and Plan / ED Course  I have reviewed the triage vital signs and the nursing notes.  Pertinent labs & imaging results that were available during my care of the patient were reviewed by me and considered in my medical decision making (see chart for details).  Final Clinical Impressions(s) / ED Diagnoses  {I have reviewed and evaluated the relevant laboratory values. {I have reviewed and evaluated the relevant imaging studies.  {I have reviewed the relevant previous healthcare records.  {I obtained HPI from historian.   ED Course:  Assessment: Pt is a 31 y.o. female presents to the Emergency Department today complaining of N/V and suprapubic abdominal pain x 2 days. Seen at [facility] yesterday for same. Notes pain causes N/V. No diarrhea. Noted dysuria without hematuria. States pain is worse with BMs as  well as positioning. MAU visit yesterday with transvaginal US done that showed bilateral ovarian cysts as well as uterine fibroid. Beta HCG negative. Noted continued symptoms and told to come to ED for other causes of symptoms. On exam, pt in NAD. Nontoxic/nonseptic appearing. VSS. Afebrile. Lungs CTA. Heart RRR. Abdomen TTP suprapubic region. CBC unremarkable. BMP unremarkable. UA negative. Given analgesia and antiemetics in ED. Likely related to bilateral ovarian cysts vs fibroid. Pain controlled in ED. Advised to follow up with GYN. Plan is to [place]. Strict return precautions  given.. At time of discharge, Patient is in no acute distress. Vital Signs are stable. Patient is able to ambulate. Patient able to tolerate PO.   Disposition/Plan:  DC Home Additional Verbal discharge instructions given and discussed with patient.  Pt Instructed to f/u with GYN in the next week for evaluation and treatment of symptoms. Return precautions given Pt acknowledges and agrees with plan  Supervising Physician Ezequiel Essex, MD  Final diagnoses:  Leiomyoma  Bilateral ovarian cysts  Suprapubic pain    New Prescriptions New Prescriptions   No medications on file     Shary Decamp, PA-C 09/29/16 [place], MD 09/29/16 [phone]

## 2016-10-13 ENCOUNTER — Ambulatory Visit (INDEPENDENT_AMBULATORY_CARE_PROVIDER_SITE_OTHER): Payer: BLUE CROSS/BLUE SHIELD | Admitting: Physician Assistant

## 2016-10-13 ENCOUNTER — Encounter: Payer: Self-pay | Admitting: Physician Assistant

## 2016-10-13 VITALS — BP 101/63 | HR 62 | Temp 98.0°F | Resp 17 | Ht 65.75 in | Wt 121.0 lb

## 2016-10-13 DIAGNOSIS — L739 Follicular disorder, unspecified: Secondary | ICD-10-CM | POA: Diagnosis not present

## 2016-10-13 DIAGNOSIS — L309 Dermatitis, unspecified: Secondary | ICD-10-CM | POA: Diagnosis not present

## 2016-10-13 DIAGNOSIS — L299 Pruritus, unspecified: Secondary | ICD-10-CM | POA: Diagnosis not present

## 2016-10-13 DIAGNOSIS — R21 Rash and other nonspecific skin eruption: Secondary | ICD-10-CM

## 2016-10-13 LAB — POCT SKIN KOH: Skin KOH, POC: NEGATIVE

## 2016-10-13 MED ORDER — TRIAMCINOLONE ACETONIDE 0.1 % EX CREA: 1.0000 "application " | TOPICAL_CREAM | Freq: Two times a day (BID) | CUTANEOUS | 0 refills | Status: DC

## 2016-10-13 MED ORDER — HYDROXYZINE HCL 25 MG PO TABS: 25.0000 mg | ORAL_TABLET | Freq: Three times a day (TID) | ORAL | 1 refills | Status: DC | PRN

## 2016-10-13 NOTE — Progress Notes (Signed)
   Alyssa Kaiser  MRN: [id number] DOB: [birth year]  PCP: System, Pcp Not In  Subjective:  Pt is a 31 year old female who presents to clinic for rash. Rash on right side of her chest and arm x 1 week.  Rash in left groin area x 1 month.  C/o burning and itching.  She has tried baby powder and an OTC cream. Did not get better.  Denies vaginal itching or burning, vaginal discharge, drainage, fever, chills. Denies new soaps, detergents, clothes. She does not wear jewelery.   Review of Systems  Constitutional: Negative for chills, fatigue and fever.  Skin: Positive for rash.    Patient Active Problem List   Diagnosis Date Noted  . Bilateral ovarian cysts 09/29/2016  . Intramural leiomyoma of uterus 09/29/2016  . Late menses 11/07/2014    Current Outpatient Prescriptions on File Prior to Visit  Medication Sig Dispense Refill  . Biotin 1000 MCG tablet Take 1,000 mcg by mouth daily.    Marland Kitchen ibuprofen (ADVIL,MOTRIN) 600 MG tablet Take 1 tablet (600 mg total) by mouth every 6 (six) hours as needed. 30 tablet 0  . Multiple Vitamins-Minerals (MULTIVITAMIN ADULTS) TABS Take 1 tablet by mouth daily.    . vitamin E (VITAMIN E) 400 UNIT capsule Take 400 Units by mouth daily.     No current facility-administered medications on file prior to visit.     Allergies  Allergen Reactions  . Other     Pt has a stomach ulcer and is unable to take certain medication(s) but is unable to recall name of medication     Objective:  BP 101/63 (BP Location: Right Arm, Patient Position: Sitting, Cuff Size: Normal)   Pulse 62   Temp 98 F (36.7 C) (Oral)   Resp 17   Ht 5' 5.75" (1.67 m)   Wt 121 lb (54.9 kg)   LMP 09/16/2016 (Exact Date)   SpO2 100%   BMI 19.68 kg/m   Physical Exam  Constitutional: She is oriented to person, place, and time and well-developed, well-nourished, and in no distress. No distress.  Cardiovascular: Normal rate, regular rhythm and normal heart sounds.   Neurological: She is  alert and oriented to person, place, and time. GCS score is 15.  Skin: Skin is warm and dry. Rash noted.  Two 1x1.5cm erythematous scaly patches right upper breast.  0.5cm papule left groin.   Psychiatric: Mood, memory, affect and judgment normal.  Vitals reviewed.  Results for orders placed or performed in visit on 10/13/16  POCT Skin KOH  Result Value Ref Range   Skin KOH, POC Negative Negative   Assessment and Plan :  1. Rash and nonspecific skin eruption 2. Eczema, unspecified type 3. Superficial folliculitis 4. Itching - POCT Skin KOH - triamcinolone cream (KENALOG) 0.1 %; Apply 1 application topically 2 (two) times daily.  Dispense: 30 g; Refill: 0 - hydrOXYzine (ATARAX/VISTARIL) 25 MG tablet; Take 1 tablet (25 mg total) by mouth 3 (three) times daily as needed for itching.  Dispense: 30 tablet; Refill: 1 - Negative skin scraping. Suspect dermatitis. Will treat topically. RTC in 2-3 weeks if no improvement. Suspect folliculitis of groin.   Mercer Pod, PA-C  [facility] [place] [facility] 10/13/2016 4:03 PM

## 2016-10-13 NOTE — Patient Instructions (Addendum)
Apply the cream to your rash twice a day.  Atarax is for itching. Take this every 6 hours as needed.  Come back in 2-3 weeks if you are seeing no signs of improvement.   Thank you for coming in today. I hope you feel we met your needs.  Feel free to call [facility] if you have any questions or further requests.  Please consider signing up for MyChart if you do not already have it, as this is a great way to communicate with me.  Best,  Whitney McVey, PA-C   IF you received an x-ray today, you will receive an invoice from [facility]. Please contact [facility] at [phone] with questions or concerns regarding your invoice.   IF you received labwork today, you will receive an invoice from [place]. Please contact [facility] at [phone] with questions or concerns regarding your invoice.   Our billing staff will not be able to assist you with questions regarding bills from these companies.  You will be contacted with the lab results as soon as they are available. The fastest way to get your results is to activate your My Chart account. Instructions are located on the last page of this paperwork. If you have not heard from [place] regarding the results in 2 weeks, please contact this office.

## 2016-10-14 ENCOUNTER — Telehealth: Payer: Self-pay | Admitting: Women's Health

## 2016-10-14 NOTE — Telephone Encounter (Signed)
Phone call for follow-up, states is feeling better less pain will try to conceive after 3 months up for Depo-Provera. Prenatal vitamin daily.

## 2016-10-15 ENCOUNTER — Encounter: Payer: Self-pay | Admitting: Gynecology

## 2016-10-28 ENCOUNTER — Ambulatory Visit (INDEPENDENT_AMBULATORY_CARE_PROVIDER_SITE_OTHER): Payer: BLUE CROSS/BLUE SHIELD | Admitting: Women's Health

## 2016-10-28 ENCOUNTER — Encounter: Payer: Self-pay | Admitting: Women's Health

## 2016-10-28 VITALS — BP 120/80 | Ht 65.0 in | Wt 121.0 lb

## 2016-10-28 DIAGNOSIS — K5900 Constipation, unspecified: Secondary | ICD-10-CM

## 2016-10-28 DIAGNOSIS — N946 Dysmenorrhea, unspecified: Secondary | ICD-10-CM

## 2016-10-28 MED ORDER — IBUPROFEN 600 MG PO TABS: 600.0000 mg | ORAL_TABLET | Freq: Four times a day (QID) | ORAL | 0 refills | Status: DC | PRN

## 2016-10-28 MED ORDER — TRANEXAMIC ACID 650 MG PO TABS: 1300.0000 mg | ORAL_TABLET | Freq: Three times a day (TID) | ORAL | 6 refills | Status: DC

## 2016-10-28 NOTE — Progress Notes (Signed)
Presents with complaint of abdominal, low pelvic pain and pain with bowel movements. Has been bleeding now 8 days initially was changing protection every hour last few days every 2-3 hours. Has more of lower left-sided pain than right side. Last cycle prior to this cycle one month ago, which also caused pelvic pain and lower left quadrant pain.. 09/29/2016 2 cm fibroid and 3 cm complex cyst. Was seen in the ER 09/28/2016 for pelvic pain/dysmenorrhea. 09/29/2016 was given Depo-Provera for dysmenorrhea and pelvic pain. Has scheduled follow-up ultrasound in June. Desires pregnancy. Having constipation,  pelvic pressure and pain with bowel movements. Has not had a bowel movement for several days. Denies vaginal discharge, urinary symptoms, fever. Desk beaking English, is from [place], husband is present who is helping to explain problem. Has dyspareunia.  Exam: Appears uncomfortable. Speculum exam, tense with exam, no visible bleeding, discharge, erythema, able to relax with encouragement. Bimanual no CMT or adnexal tenderness. Tenderness above level of ovary on the left lower quadrant.   Dysmenorrhea Dyspareunia Small fibroid and left ovarian cyst Constipation  Plan: Keep scheduled follow-up ultrasound appointment. Reviewed small fibroid and cyst not entire reason for dysmenorrhea. Options reviewed, continue prenatal vitamin daily. Lysteda 650 mg 2 tablets 3 times daily with cycle, prescription, proper use given and reviewed. Will call if no relief of symptoms. Vaginal lubricants encouraged, relaxation techniques reviewed and encouraged. Several sample packets of MiraLAX given with instructions. Encouraged to increase fluids and fiber in diet.

## 2016-10-28 NOTE — Patient Instructions (Addendum)
Dysmenorrhea Dysmenorrhea means painful cramps during your period (menstrual period). You will have pain in your lower belly (abdomen). The pain is caused by the tightening (contracting) of the muscles of the womb (uterus). The pain may be mild or very bad. With this condition, you may:  Have a headache.  Feel sick to your stomach (nauseous).  Throw up (vomit).  Have lower back pain. Follow these instructions at home: Helping pain and cramping   Put heat on your lower back or belly when you have pain or cramps. Use the heat source that your doctor tells you to use.  Place a towel between your skin and the heat.  Leave the heat on for 20-30 minutes.  Remove the heat if your skin turns bright red. This is especially important if you cannot feel pain, heat, or cold.  Do not have a heating pad on during sleep.  Do aerobic exercises. These include walking, swimming, or biking. These may help with cramps.  Massage your lower back or belly. This may help lessen pain. General instructions   Take over-the-counter and prescription medicines only as told by your doctor.  Do not drive or use heavy machinery while taking prescription pain medicine.  Avoid alcohol and caffeine during and right before your period. These can make cramps worse.  Do not use any products that have nicotine or tobacco. These include cigarettes and e-cigarettes. If you need help quitting, ask your doctor.  Keep all follow-up visits as told by your doctor. This is important. Contact a doctor if:  You have pain that gets worse.  You have pain that does not get better with medicine.  You have pain during sex.  You feel sick to your stomach or you throw up during your period, and medicine does not help. Get help right away if:  You pass out (faint). Summary  Dysmenorrhea means painful cramps during your period (menstrual period).  Put heat on your lower back or belly when you have pain or cramps.  Do  exercises like walking, swimming, or biking to help with cramps.  Contact a doctor if you have pain during sex. This information is not intended to replace advice given to you by your health care provider. Make sure you discuss any questions you have with your health care provider. Document Released: 08/15/2008 Document Revised: 06/05/2016 Document Reviewed: 06/05/2016 Elsevier Interactive Patient Education  2017 Elsevier Inc. Dysfunctional Uterine Bleeding Dysfunctional uterine bleeding is abnormal bleeding from the uterus. Dysfunctional uterine bleeding includes:  A period that comes earlier or later than usual.  A period that is lighter, heavier, or has blood clots.  Bleeding between periods.  Skipping one or more periods.  Bleeding after sexual intercourse.  Bleeding after menopause. Follow these instructions at home: Pay attention to any changes in your symptoms. Follow these instructions to help with your condition: Eating and drinking   Eat well-balanced meals. Include foods that are high in iron, such as liver, meat, shellfish, green leafy vegetables, and eggs.  If you become constipated:  Drink plenty of water.  Eat fruits and vegetables that are high in water and fiber, such as spinach, carrots, raspberries, apples, and mango. Medicines   Take over-the-counter and prescription medicines only as told by your health care provider.  Do not change medicines without talking with your health care provider.  Aspirin or medicines that contain aspirin may make the bleeding worse. Do not take those medicines:  During the week before your period.  During your  period.  If you were prescribed iron pills, take them as told by your health care provider. Iron pills help to replace iron that your body loses because of this condition. Activity   If you need to change your sanitary pad or tampon more than one time every 2 hours:  Lie in bed with your feet raised  (elevated).  Place a cold pack on your lower abdomen.  Rest as much as possible until the bleeding stops or slows down.  Do not try to lose weight until the bleeding has stopped and your blood iron level is back to normal. Other Instructions   For two months, write down:  When your period starts.  When your period ends.  When any abnormal bleeding occurs.  What problems you notice.  Keep all follow up visits as told by your health care provider. This is important. Contact a health care provider if:  You get light-headed or weak.  You have nausea and vomiting.  You cannot eat or drink without vomiting.  You feel dizzy or have diarrhea while you are taking medicines.  You are taking birth control pills or hormones, and you want to change them or stop taking them. Get help right away if:  You develop a fever or chills.  You need to change your sanitary pad or tampon more than one time per hour.  Your bleeding becomes heavier, or your flow contains clots more often.  You develop pain in your abdomen.  You lose consciousness.  You develop a rash. This information is not intended to replace advice given to you by your health care provider. Make sure you discuss any questions you have with your health care provider. Document Released: 05/16/2000 Document Revised: 10/25/2015 Document Reviewed: 08/14/2014 Elsevier Interactive Patient Education  2017 [employer]. About Constipation  Constipation Overview Constipation is the most common gastrointestinal complaint - about 4 million Americans experience constipation and make 2.5 million physician visits a year to get help for the problem.  Constipation can occur when the colon absorbs too much water, the colon's muscle contraction is slow or sluggish, and/or there is delayed transit time through the colon.  The result is stool that is hard and dry.  Indicators of constipation include straining during bowel movements greater  than 25% of the time, having fewer than three bowel movements per week, and/or the feeling of incomplete evacuation.  There are established guidelines ([place] II ) for defining constipation. A person needs to have two or more of the following symptoms for at least 12 weeks (not necessarily consecutive) in the preceding 12 months: . Straining in  greater than 25% of bowel movements . Lumpy or hard stools in greater than 25% of bowel movements . Sensation of incomplete emptying in greater than 25% of bowel movements . Sensation of anorectal obstruction/blockade in greater than 25% of bowel movements . Manual maneuvers to help empty greater than 25% of bowel movements (e.g., digital evacuation, support of the pelvic floor)  . Less than  3 bowel movements/week . Loose stools are not present, and criteria for irritable bowel syndrome are insufficient  Common Causes of Constipation . Lack of fiber in your diet . Lack of physical activity . Medications, including iron and calcium supplements  . Dairy intake . Dehydration . Abuse of laxatives  Travel  Irritable Bowel Syndrome  Pregnancy  Luteal phase of menstruation (after ovulation and before menses)  Colorectal problems  Intestinal Dysfunction  Treating Constipation  There are several ways  of treating constipation, including changes to diet and exercise, use of laxatives, adjustments to the pelvic floor, and scheduled toileting.  These treatments include: . increasing fiber and fluids in the diet  . increasing physical activity . learning muscle coordination   learning proper toileting techniques and toileting modifications   designing and sticking  to a toileting schedule     2007, [employer] Doc.22

## 2016-12-29 ENCOUNTER — Encounter: Payer: Self-pay | Admitting: Physician Assistant

## 2016-12-29 ENCOUNTER — Ambulatory Visit (INDEPENDENT_AMBULATORY_CARE_PROVIDER_SITE_OTHER): Payer: BLUE CROSS/BLUE SHIELD | Admitting: Physician Assistant

## 2016-12-29 VITALS — BP 103/65 | HR 71 | Temp 97.8°F | Resp 18 | Ht 65.91 in | Wt 123.0 lb

## 2016-12-29 DIAGNOSIS — R21 Rash and other nonspecific skin eruption: Secondary | ICD-10-CM

## 2016-12-29 DIAGNOSIS — B354 Tinea corporis: Secondary | ICD-10-CM

## 2016-12-29 LAB — POCT SKIN KOH: Skin KOH, POC: NEGATIVE

## 2016-12-29 MED ORDER — TERBINAFINE HCL 250 MG PO TABS: 250.0000 mg | ORAL_TABLET | Freq: Every day | ORAL | 0 refills | Status: AC

## 2016-12-29 MED ORDER — CLOTRIMAZOLE-BETAMETHASONE 1-0.05 % EX CREA: 1.0000 "application " | TOPICAL_CREAM | Freq: Two times a day (BID) | CUTANEOUS | 0 refills | Status: DC

## 2016-12-29 NOTE — Patient Instructions (Addendum)
I am treating you today for a fungal infection. Read directions below.   Terbinafine 250 mg per day for one to two weeks  Apply cream twice daily to affected area for at least 2 weeks. Continue topical cream for at least 1 week after your symptoms get better.   Come back if you are not better in 3-4 weeks.   Thank you for coming in today. I hope you feel we met your needs.  Feel free to call PCP if you have any questions or further requests.  Please consider signing up for MyChart if you do not already have it, as this is a great way to communicate with me.  Best,  Whitney McVey, PA-C   Follow these instructions at home:  Take over-the-counter and prescription medicines only as told by your health care provider.  If you were given an antifungal cream or ointment: ? Use it as told by your health care provider. ? Wash the infected area and dry it completely before applying the cream or ointment.  While you have a rash: ? Wear loose clothing to stop clothes from rubbing and irritating it. ? Wash or change your bed sheets every night.  If your pet has the same infection, take your pet to see a Animal nutritionist. How is this prevented?  Practice good hygiene.  Wear sandals or shoes in public places and showers.  Do not share personal items with others.  Avoid touching red patches of skin on other people.  Avoid touching pets that have bald spots.  If you touch an animal that has a bald spot, wash your hands. Contact a health care provider if:  Your rash continues to spread after 7 days of treatment.  Your rash is not gone in 4 weeks.  The area around your rash gets red, warm, tender, and swollen. This information is not intended to replace advice given to you by your health care provider. Make sure you discuss any questions you have with your health care provider. Document Released: 05/16/2000 Document Revised: 10/25/2015 Document Reviewed: 03/15/2015 Elsevier Interactive  Patient Education  2018 [employer].   IF you received an x-ray today, you will receive an invoice from [facility]. Please contact [facility] at [phone] with questions or concerns regarding your invoice.   IF you received labwork today, you will receive an invoice from [facility]. Please contact [facility] at [phone] with questions or concerns regarding your invoice.   Our billing staff will not be able to assist you with questions regarding bills from these companies.  You will be contacted with the lab results as soon as they are available. The fastest way to get your results is to activate your My Chart account. Instructions are located on the last page of this paperwork. If you have not heard from [place] regarding the results in 2 weeks, please contact this office.

## 2016-12-29 NOTE — Progress Notes (Signed)
   Alyssa Kaiser  MRN: [id number] DOB: [birth year]  PCP: Dorise Hiss, PA-C  Subjective:  Pt is a 31 year old female who presents to clinic for rash x 6 months on right breast and left thigh.   She was here 10/13/2016. Negative Skin KOH. Treated with Kenalog 0.1%. She applied this daily. Rash did not get better "It got worse". C/o itching.  Denies fever, chills, drainage, increased warmth of skin.    Review of Systems  Constitutional: Negative for chills, diaphoresis and fever.  Respiratory: Negative for cough and shortness of breath.   Gastrointestinal: Negative for nausea and vomiting.  Musculoskeletal: Negative for arthralgias.  Skin: Positive for rash.    Patient Active Problem List   Diagnosis Date Noted  . Bilateral ovarian cysts 09/29/2016  . Intramural leiomyoma of uterus 09/29/2016  . Late menses 11/07/2014    Current Outpatient Prescriptions on File Prior to Visit  Medication Sig Dispense Refill  . Biotin 1000 MCG tablet Take 1,000 mcg by mouth daily.    Marland Kitchen ibuprofen (ADVIL,MOTRIN) 600 MG tablet Take 1 tablet (600 mg total) by mouth every 6 (six) hours as needed. 30 tablet 0  . Multiple Vitamins-Minerals (MULTIVITAMIN ADULTS) TABS Take 1 tablet by mouth daily.    . tranexamic acid (LYSTEDA) 650 MG TABS tablet Take 2 tablets (1,300 mg total) by mouth 3 (three) times daily. Take with cycle for 5 days 30 tablet 6  . vitamin E (VITAMIN E) 400 UNIT capsule Take 400 Units by mouth daily.     No current facility-administered medications on file prior to visit.     Allergies  Allergen Reactions  . Other     Pt has a stomach ulcer and is unable to take certain medication(s) but is unable to recall name of medication     Objective:  BP 103/65 (BP Location: Right Arm, Patient Position: Sitting, Cuff Size: Normal)   Pulse 71   Temp 97.8 F (36.6 C) (Oral)   Resp 18   Ht 5' 5.91" (1.674 m)   Wt 123 lb (55.8 kg)   SpO2 98%   BMI 19.91 kg/m   Physical Exam    Constitutional: She is oriented to person, place, and time and well-developed, well-nourished, and in no distress. No distress.  Cardiovascular: Normal rate, regular rhythm and normal heart sounds.   Neurological: She is alert and oriented to person, place, and time. GCS score is 15.  Skin: Skin is warm and dry. Rash noted.     Scaling rash with central clearing right breast superior to areola. No vesicles, drainage, weeping.   Psychiatric: Mood, memory, affect and judgment normal.  Vitals reviewed.   Results for orders placed or performed in visit on 12/29/16  POCT Skin KOH  Result Value Ref Range   Skin KOH, POC Negative Negative    Assessment and Plan :  1. Rash and nonspecific skin eruption 2. Tinea corporis - POCT Skin KOH - clotrimazole-betamethasone (LOTRISONE) cream; Apply 1 application topically 2 (two) times daily.  Dispense: 30 g; Refill: 0 - terbinafine (LAMISIL) 250 MG tablet; Take 1 tablet (250 mg total) by mouth daily.  Dispense: 14 tablet; Refill: 0 - Worsening rash. Negative KOH x 2. Suspect false negative, will treat for tinea. She was treated with Kenalog at her last OV 2 months ago - rash worsened. RTC in 3-4 weeks if no improvement.    Mercer Pod, PA-C  [facility] 12/29/2016 11:18 AM

## 2017-01-05 ENCOUNTER — Other Ambulatory Visit: Payer: BLUE CROSS/BLUE SHIELD

## 2017-01-05 ENCOUNTER — Ambulatory Visit: Payer: BLUE CROSS/BLUE SHIELD | Admitting: Women's Health

## 2017-01-12 ENCOUNTER — Telehealth: Payer: Self-pay | Admitting: Physician Assistant

## 2017-01-12 ENCOUNTER — Other Ambulatory Visit: Payer: Self-pay | Admitting: Physician Assistant

## 2017-01-12 DIAGNOSIS — B354 Tinea corporis: Secondary | ICD-10-CM

## 2017-01-12 MED ORDER — CLOTRIMAZOLE-BETAMETHASONE 1-0.05 % EX CREA: 1.0000 "application " | TOPICAL_CREAM | Freq: Two times a day (BID) | CUTANEOUS | 0 refills | Status: DC

## 2017-01-12 NOTE — Telephone Encounter (Signed)
Do you want her to come in for an OV ?

## 2017-01-12 NOTE — Telephone Encounter (Signed)
Pt came in asking if she can have a refill on the ointment that was prescribed to her a month ago when she was seen & she says that if there is a bigger size that she could have instead of the small one she was given last time.   Please Advise  [phone]

## 2017-01-12 NOTE — Telephone Encounter (Signed)
I filled cream. She should come in if she needs another refill after this. TY

## 2017-01-13 ENCOUNTER — Other Ambulatory Visit: Payer: Self-pay | Admitting: Women's Health

## 2017-01-13 DIAGNOSIS — D251 Intramural leiomyoma of uterus: Secondary | ICD-10-CM

## 2017-01-13 DIAGNOSIS — N83201 Unspecified ovarian cyst, right side: Secondary | ICD-10-CM

## 2017-01-13 DIAGNOSIS — N83202 Unspecified ovarian cyst, left side: Secondary | ICD-10-CM

## 2017-01-13 DIAGNOSIS — R102 Pelvic and perineal pain: Secondary | ICD-10-CM

## 2017-01-26 ENCOUNTER — Ambulatory Visit (INDEPENDENT_AMBULATORY_CARE_PROVIDER_SITE_OTHER): Payer: BLUE CROSS/BLUE SHIELD | Admitting: Women's Health

## 2017-01-26 ENCOUNTER — Encounter: Payer: Self-pay | Admitting: Women's Health

## 2017-01-26 ENCOUNTER — Ambulatory Visit (INDEPENDENT_AMBULATORY_CARE_PROVIDER_SITE_OTHER): Payer: BLUE CROSS/BLUE SHIELD

## 2017-01-26 VITALS — BP 110/70 | Ht 65.0 in | Wt 123.0 lb

## 2017-01-26 DIAGNOSIS — N83202 Unspecified ovarian cyst, left side: Secondary | ICD-10-CM

## 2017-01-26 DIAGNOSIS — D251 Intramural leiomyoma of uterus: Secondary | ICD-10-CM

## 2017-01-26 DIAGNOSIS — R102 Pelvic and perineal pain: Secondary | ICD-10-CM

## 2017-01-26 DIAGNOSIS — N83201 Unspecified ovarian cyst, right side: Secondary | ICD-10-CM

## 2017-01-26 NOTE — Patient Instructions (Signed)
Uterine Fibroids Uterine fibroids are tissue masses (tumors). They are also called leiomyomas. They can develop inside of a woman's womb (uterus). They can grow very large. Fibroids are not cancerous (benign). Most fibroids do not require medical treatment. Follow these instructions at home:  Keep all follow-up visits as told by your doctor. This is important.  Take medicines only as told by your doctor. ? If you were prescribed a hormone treatment, take the hormone medicines exactly as told. ? Do not take aspirin. It can cause bleeding.  Ask your doctor about taking iron pills and increasing the amount of dark green, leafy vegetables in your diet. These actions can help to boost your blood iron levels.  Pay close attention to your period. Tell your doctor about any changes, such as: ? Increased blood flow. This may require you to use more pads or tampons than usual per month. ? A change in the number of days that your period lasts per month. ? A change in symptoms that come with your period, such as back pain or cramping in your belly area (abdomen). Contact a doctor if:  You have pain in your back or the area between your hip bones (pelvic area) that is not controlled by medicines.  You have pain in your abdomen that is not controlled with medicines.  You have an increase in bleeding between and during periods.  You soak tampons or pads in a half hour or less.  You feel lightheaded.  You feel extra tired.  You feel weak. Get help right away if:  You pass out (faint).  You have a sudden increase in pelvic pain. This information is not intended to replace advice given to you by your health care provider. Make sure you discuss any questions you have with your health care provider. Document Released: 06/21/2010 Document Revised: 01/18/2016 Document Reviewed: 11/15/2013 Elsevier Interactive Patient Education  2018 Elsevier Inc.  

## 2017-01-26 NOTE — Progress Notes (Signed)
Presents for follow-up ultrasound. 09/29/2016 had severe pain was found to have a 3 cm right ovarian cyst, 2 cm fibroid, questionable endometrial polyp noted on ultrasound. She was given Depo-Provera on 09/29/2016 has had some irregular spottingafter but is now getting back to regular cycles. Desires pregnancy, taking prenatal vitamin daily, aware of safe pregnancy behaviors. Has had a normal TSH, prolactin and FSH. Declines referral to infertility at this time. 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  Exam: Appears well. Husband accompanied. Ultrasound: T/V anteverted uterus with intramural fibroid 27 x 18 mm on the posterior wall of the endometrium. Endometrium normal, 3.3 mm. Previous endometrial focus not seen. Right and left ovary normal. Negative cul-de-sac. Previous cyst not seen.  Resolved right ovarian cyst Small fibroid Desired pregnancy  Plan: Ultrasound results reviewed. Reviewed common to have irregular spotting and may have a slower return to ovulation after Depo-Provera. Encouraged to continue frequent intercourse, return to office with missed cycle, aware we no longer deliver. Continue prenatal vitamins, safe pregnancy behaviors. Instructed to call if cycles not monthly.

## 2017-06-02 ENCOUNTER — Encounter (HOSPITAL_COMMUNITY): Payer: Self-pay | Admitting: *Deleted

## 2017-06-02 ENCOUNTER — Other Ambulatory Visit: Payer: Self-pay

## 2017-06-02 ENCOUNTER — Emergency Department (HOSPITAL_COMMUNITY)
Admission: EM | Admit: 2017-06-02 | Discharge: 2017-06-02 | Disposition: A | Discharge status: Home / Self Care | Payer: BLUE CROSS/BLUE SHIELD | Attending: Physician Assistant | Admitting: Physician Assistant

## 2017-06-02 DIAGNOSIS — R112 Nausea with vomiting, unspecified: Secondary | ICD-10-CM | POA: Insufficient documentation

## 2017-06-02 DIAGNOSIS — R197 Diarrhea, unspecified: Secondary | ICD-10-CM | POA: Insufficient documentation

## 2017-06-02 LAB — COMPREHENSIVE METABOLIC PANEL
ALBUMIN: 4.7 g/dL (ref 3.5–5.0)
ALT: 17 U/L (ref 14–54)
AST: 24 U/L (ref 15–41)
Alkaline Phosphatase: 43 U/L (ref 38–126)
Anion gap: 9 (ref 5–15)
BUN: 21 mg/dL — ABNORMAL HIGH (ref 6–20)
CHLORIDE: 103 mmol/L (ref 101–111)
CO2: 23 mmol/L (ref 22–32)
CREATININE: 0.78 mg/dL (ref 0.44–1.00)
Calcium: 9.4 mg/dL (ref 8.9–10.3)
GFR calc non Af Amer: >60 mL/min (ref >60)
GLUCOSE: 109 mg/dL — AB (ref 65–99)
Potassium: 3.7 mmol/L (ref 3.5–5.1)
SODIUM: 135 mmol/L (ref 135–145)
Total Bilirubin: 0.9 mg/dL (ref 0.3–1.2)
Total Protein: 8.3 g/dL — ABNORMAL HIGH (ref 6.5–8.1)

## 2017-06-02 LAB — URINALYSIS, ROUTINE W REFLEX MICROSCOPIC
Bacteria, UA: NONE SEEN
Bilirubin Urine: NEGATIVE
Glucose, UA: NEGATIVE mg/dL
KETONES UR: NEGATIVE mg/dL
Leukocytes, UA: NEGATIVE
Nitrite: NEGATIVE
PROTEIN: NEGATIVE mg/dL
RBC / HPF: NONE SEEN RBC/hpf (ref 0–5)
Specific Gravity, Urine: 1.014 (ref 1.005–1.030)
pH: 5 (ref 5.0–8.0)

## 2017-06-02 LAB — CBC
HCT: 38.7 % (ref 36.0–46.0)
Hemoglobin: 13.2 g/dL (ref 12.0–15.0)
MCH: 30.8 pg (ref 26.0–34.0)
MCHC: 34.1 g/dL (ref 30.0–36.0)
MCV: 90.4 fL (ref 78.0–100.0)
Platelets: 325 10*3/uL (ref 150–400)
RBC: 4.28 MIL/uL (ref 3.87–5.11)
RDW: 12.2 % (ref 11.5–15.5)
WBC: 13.5 10*3/uL — ABNORMAL HIGH (ref 4.0–10.5)

## 2017-06-02 LAB — I-STAT BETA HCG BLOOD, ED (MC, WL, AP ONLY): I-stat hCG, quantitative: <5 m[IU]/mL

## 2017-06-02 LAB — LIPASE, BLOOD: LIPASE: 25 U/L (ref 11–51)

## 2017-06-02 MED ORDER — SODIUM CHLORIDE 0.9 % IV BOLUS (SEPSIS)
1000.0000 mL | Freq: Once | INTRAVENOUS | Status: AC
  Administered 2017-06-02: 1000 mL via INTRAVENOUS

## 2017-06-02 MED ORDER — ONDANSETRON 4 MG PO TBDP
4.0000 mg | ORAL_TABLET | Freq: Once | ORAL | Status: AC | PRN
  Administered 2017-06-02: 4 mg via ORAL
  Filled 2017-06-02: qty 1

## 2017-06-02 MED ORDER — KETOROLAC TROMETHAMINE 30 MG/ML IJ SOLN
30.0000 mg | Freq: Once | INTRAMUSCULAR | Status: AC
  Administered 2017-06-02: 30 mg via INTRAVENOUS
  Filled 2017-06-02: qty 1

## 2017-06-02 MED ORDER — ONDANSETRON 4 MG PO TBDP: 4.0000 mg | ORAL_TABLET | Freq: Three times a day (TID) | ORAL | 0 refills | Status: DC | PRN

## 2017-06-02 MED ORDER — ONDANSETRON HCL 4 MG/2ML IJ SOLN
4.0000 mg | Freq: Once | INTRAMUSCULAR | Status: AC
  Administered 2017-06-02: 4 mg via INTRAVENOUS
  Filled 2017-06-02: qty 2

## 2017-06-02 NOTE — ED Triage Notes (Addendum)
Pt reports eating something last night and began to develop vomiting and diarrhea. Pt's husband at the same thing but only developed diarrhea.  Pt a/o x 4 and ambulatory. Pt reports lower abd pain. Husband states she was given liquid pepto bismal and also two imodiums without much relief.

## 2017-06-02 NOTE — ED Notes (Signed)
Patient reports that she didn't have diarrhea when she went to restroom. Patient adds that her n/v has gotten better. Patient given ginger ale to fluid challenge to make sure patient can tolerate PO fluids.

## 2017-06-02 NOTE — ED Notes (Signed)
Patient husband states that patient feeling much better and took some sips of ginger ale and able to tolerate, so ready to go home. Made Dr Thomasene Lot aware.

## 2017-06-02 NOTE — ED Provider Notes (Signed)
[facility] DEPT Provider Note   CSN: [id number] Arrival date & time: 06/02/17  1155     History   Chief Complaint Chief Complaint  Patient presents with  . Emesis    HPI Alyssa Kaiser is a 32 y.o. female.  HPI   32 year old female presenting with nausea vomiting diarrhea.  Patient and husband ate at a restaurant where they had pizza with vegetables and chicken that appeared uncooked.  Patient's husband had profuse diarrhea overnight.  Patient had diarrhea and vomiting.  Patient has no abdominal pain.  No fevers.  Patient took 2 Imodium which did help.  Past Medical History:  Diagnosis Date  . Depression     Patient Active Problem List   Diagnosis Date Noted  . Bilateral ovarian cysts 09/29/2016  . Intramural leiomyoma of uterus 09/29/2016  . Late menses 11/07/2014    Past Surgical History:  Procedure Laterality Date  . APPENDECTOMY      OB History    Gravida Para Term Preterm AB Living   0 0 0 0 0 0   SAB TAB Ectopic Multiple Live Births   0 0 0 0 0       Home Medications    Prior to Admission medications   Medication Sig Start Date End Date Taking? Authorizing Provider  Biotin 1000 MCG tablet Take 1,000 mcg by mouth daily.    [provider]  ibuprofen (ADVIL,MOTRIN) 600 MG tablet Take 1 tablet (600 mg total) by mouth every 6 (six) hours as needed. 10/28/16   Huel Cote, NP  Multiple Vitamins-Minerals (MULTIVITAMIN ADULTS) TABS Take 1 tablet by mouth daily.    [provider]  vitamin E (VITAMIN E) 400 UNIT capsule Take 400 Units by mouth daily.    [provider]    Family History Family History  Problem Relation Age of Onset  . Thyroid disease Mother     Social History Social History   Tobacco Use  . Smoking status: Never Smoker  . Smokeless tobacco: Never Used  Substance Use Topics  . Alcohol use: No    Alcohol/week: 0.0 oz  . Drug use: No     Allergies   Other   Review  of Systems Review of Systems  Constitutional: Negative for activity change.  Respiratory: Negative for shortness of breath.   Cardiovascular: Negative for chest pain.  Gastrointestinal: Positive for diarrhea, nausea and vomiting. Negative for abdominal pain.     Physical Exam Updated Vital Signs BP 102/69 (BP Location: Left Arm)   Pulse 79   Temp 98.5 F (36.9 C) (Oral)   Resp 16   Ht 5\' 6"  (1.676 m)   Wt 56.7 kg (125 lb)   LMP 05/26/2017   SpO2 100%   BMI 20.18 kg/m   Physical Exam  Constitutional: She is oriented to person, place, and time. She appears well-developed and well-nourished.  HENT:  Head: Normocephalic and atraumatic.  Eyes: Right eye exhibits no discharge. Left eye exhibits no discharge.  Cardiovascular: Normal rate and regular rhythm.  Pulmonary/Chest: Effort normal and breath sounds normal.  Abdominal: Soft. Bowel sounds are normal. She exhibits no distension. There is no tenderness.  Neurological: She is oriented to person, place, and time.  Skin: Skin is warm and dry. She is not diaphoretic.  Psychiatric: She has a normal mood and affect.  Nursing note and vitals reviewed.    ED Treatments / Results  Labs (all labs ordered are listed, but only abnormal results are  displayed) Labs Reviewed  COMPREHENSIVE METABOLIC PANEL - Abnormal; Notable for the following components:      Result Value   Glucose, Bld 109 (*)    BUN 21 (*)    Total Protein 8.3 (*)    All other components within normal limits  CBC - Abnormal; Notable for the following components:   WBC 13.5 (*)    All other components within normal limits  URINALYSIS, ROUTINE W REFLEX MICROSCOPIC - Abnormal; Notable for the following components:   Hgb urine dipstick MODERATE (*)    Squamous Epithelial / LPF 0-5 (*)    All other components within normal limits  LIPASE, BLOOD  I-STAT BETA HCG BLOOD, ED ([facility], [facility], [facility] ONLY)    EKG  EKG Interpretation None       Radiology No results  found.  Procedures Procedures (including critical care time)  Medications Ordered in ED Medications  sodium chloride 0.9 % bolus 1,000 mL (not administered)  ondansetron (ZOFRAN) injection 4 mg (not administered)  ketorolac (TORADOL) 30 MG/ML injection 30 mg (not administered)  ondansetron (ZOFRAN-ODT) disintegrating tablet 4 mg (4 mg Oral Given 06/02/17 1221)     Initial Impression / Assessment and Plan / ED Course  I have reviewed the triage vital signs and the nursing notes.  Pertinent labs & imaging results that were available during my care of the patient were reviewed by me and considered in my medical decision making (see chart for details).     32 year old female presenting with nausea vomiting diarrhea.  Patient and husband ate at a restaurant where they had pizza with vegetables and chicken that appeared uncooked.  Patient's husband had profuse diarrhea overnight.  Patient had diarrhea and vomiting.  Patient has no abdominal pain.  No fevers.  Patient took 2 Imodium which did help.  4:57 PM Patient sounds consistent with foodborne illness.  Normal vital signs normal labs.  Will give fluids Zofran and have patient rest at home.  Final Clinical Impressions(s) / ED Diagnoses   Final diagnoses:  None    ED Discharge Orders    None       Macarthur Critchley, MD 06/02/17 1657

## 2017-06-02 NOTE — Discharge Instructions (Signed)
We think your body back like reacted to something you ate.  Please be sure to use this medicine help with nausea.  Stay hydrated with a mix of Gatorade and water.  Please return with any concerns.  Including fever or increasing pain.

## 2017-07-07 ENCOUNTER — Ambulatory Visit (INDEPENDENT_AMBULATORY_CARE_PROVIDER_SITE_OTHER): Payer: 59 | Admitting: Women's Health

## 2017-07-07 ENCOUNTER — Ambulatory Visit (INDEPENDENT_AMBULATORY_CARE_PROVIDER_SITE_OTHER): Payer: 59

## 2017-07-07 ENCOUNTER — Other Ambulatory Visit: Payer: Self-pay | Admitting: Women's Health

## 2017-07-07 ENCOUNTER — Encounter: Payer: Self-pay | Admitting: Women's Health

## 2017-07-07 VITALS — BP 98/78

## 2017-07-07 DIAGNOSIS — N912 Amenorrhea, unspecified: Secondary | ICD-10-CM

## 2017-07-07 DIAGNOSIS — R1030 Lower abdominal pain, unspecified: Secondary | ICD-10-CM

## 2017-07-07 DIAGNOSIS — Z3491 Encounter for supervision of normal pregnancy, unspecified, first trimester: Secondary | ICD-10-CM

## 2017-07-07 DIAGNOSIS — N831 Corpus luteum cyst of ovary, unspecified side: Secondary | ICD-10-CM | POA: Diagnosis not present

## 2017-07-07 DIAGNOSIS — R300 Dysuria: Secondary | ICD-10-CM | POA: Diagnosis not present

## 2017-07-07 DIAGNOSIS — Z3A01 Less than 8 weeks gestation of pregnancy: Secondary | ICD-10-CM | POA: Diagnosis not present

## 2017-07-07 LAB — PREGNANCY, URINE: Preg Test, Ur: POSITIVE — AB

## 2017-07-07 MED ORDER — SULFAMETHOXAZOLE-TRIMETHOPRIM 800-160 MG PO TABS: 1.0000 | ORAL_TABLET | Freq: Two times a day (BID) | ORAL | 0 refills | Status: DC

## 2017-07-07 NOTE — Patient Instructions (Signed)
First Trimester of Pregnancy The first trimester of pregnancy is from week 1 until the end of week 13 (months 1 through 3). During this time, your baby will begin to develop inside you. At 6-8 weeks, the eyes and face are formed, and the heartbeat can be seen on ultrasound. At the end of 12 weeks, all the baby's organs are formed. Prenatal care is all the medical care you receive before the birth of your baby. Make sure you get good prenatal care and follow all of your doctor's instructions. Follow these instructions at home: Medicines  Take over-the-counter and prescription medicines only as told by your doctor. Some medicines are safe and some medicines are not safe during pregnancy.  Take a prenatal vitamin that contains at least 600 micrograms (mcg) of folic acid.  If you have trouble pooping (constipation), take medicine that will make your stool soft (stool softener) if your doctor approves. Eating and drinking  Eat regular, healthy meals.  Your doctor will tell you the amount of weight gain that is right for you.  Avoid raw meat and uncooked cheese.  If you feel sick to your stomach (nauseous) or throw up (vomit): ? Eat 4 or 5 small meals a day instead of 3 large meals. ? Try eating a few soda crackers. ? Drink liquids between meals instead of during meals.  To prevent constipation: ? Eat foods that are high in fiber, like fresh fruits and vegetables, whole grains, and beans. ? Drink enough fluids to keep your pee (urine) clear or pale yellow. Activity  Exercise only as told by your doctor. Stop exercising if you have cramps or pain in your lower belly (abdomen) or low back.  Do not exercise if it is too hot, too humid, or if you are in a place of great height (high altitude).  Try to avoid standing for long periods of time. Move your legs often if you must stand in one place for a long time.  Avoid heavy lifting.  Wear low-heeled shoes. Sit and stand up straight.  You  can have sex unless your doctor tells you not to. Relieving pain and discomfort  Wear a good support bra if your breasts are sore.  Take warm water baths (sitz baths) to soothe pain or discomfort caused by hemorrhoids. Use hemorrhoid cream if your doctor says it is okay.  Rest with your legs raised if you have leg cramps or low back pain.  If you have puffy, bulging veins (varicose veins) in your legs: ? Wear support hose or compression stockings as told by your doctor. ? Raise (elevate) your feet for 15 minutes, 3-4 times a day. ? Limit salt in your food. Prenatal care  Schedule your prenatal visits by the twelfth week of pregnancy.  Write down your questions. Take them to your prenatal visits.  Keep all your prenatal visits as told by your doctor. This is important. Safety  Wear your seat belt at all times when driving.  Make a list of emergency phone numbers. The list should include numbers for family, friends, the hospital, and police and fire departments. General instructions  Ask your doctor for a referral to a local prenatal class. Begin classes no later than at the start of month 6 of your pregnancy.  Ask for help if you need counseling or if you need help with nutrition. Your doctor can give you advice or tell you where to go for help.  Do not use hot tubs, steam rooms, or   saunas.  Do not douche or use tampons or scented sanitary pads.  Do not cross your legs for long periods of time.  Avoid all herbs and alcohol. Avoid drugs that are not approved by your doctor.  Do not use any tobacco products, including cigarettes, chewing tobacco, and electronic cigarettes. If you need help quitting, ask your doctor. You may get counseling or other support to help you quit.  Avoid cat litter boxes and soil used by cats. These carry germs that can cause birth defects in the baby and can cause a loss of your baby (miscarriage) or stillbirth.  Visit your dentist. At home, brush  your teeth with a soft toothbrush. Be gentle when you floss. Contact a doctor if:  You are dizzy.  You have mild cramps or pressure in your lower belly.  You have a nagging pain in your belly area.  You continue to feel sick to your stomach, you throw up, or you have watery poop (diarrhea).  You have a bad smelling fluid coming from your vagina.  You have pain when you pee (urinate).  You have increased puffiness (swelling) in your face, hands, legs, or ankles. Get help right away if:  You have a fever.  You are leaking fluid from your vagina.  You have spotting or bleeding from your vagina.  You have very bad belly cramping or pain.  You gain or lose weight rapidly.  You throw up blood. It may look like coffee grounds.  You are around people who have German measles, fifth disease, or chickenpox.  You have a very bad headache.  You have shortness of breath.  You have any kind of trauma, such as from a fall or a car accident. Summary  The first trimester of pregnancy is from week 1 until the end of week 13 (months 1 through 3).  To take care of yourself and your unborn baby, you will need to eat healthy meals, take medicines only if your doctor tells you to do so, and do activities that are safe for you and your baby.  Keep all follow-up visits as told by your doctor. This is important as your doctor will have to ensure that your baby is healthy and growing well. This information is not intended to replace advice given to you by your health care provider. Make sure you discuss any questions you have with your health care provider. Document Released: 11/05/2007 Document Revised: 05/27/2016 Document Reviewed: 05/27/2016 Elsevier Interactive Patient Education  2017 [employer]  

## 2017-07-07 NOTE — Progress Notes (Signed)
31 yo MWF G0 presents with complaint of diffuse abdominal pain, urinary frequency and dysuria starting 2 days ago. Abdominal pain is constant, throughout abdomen with complaint of bloating, mild constipation, and occasional rectal pain. Normal bowel movement yesterday. Urinated 10 times last night. Denies back pain, nausea, vomiting, or fever. Denies vaginal discharge or irregular bleeding. 06/02/17 - seen in ED for food poisoning.  Hx of appendectomy, resolved right ovarian cyst, 12/2016 27 x 18 mm intramural fibroid. LMP 05/29/2017, desires conception. From [place], moved here in 2009 . Currently works at [employer] in [employer].  Exam: Appears well, in no acute distress. Husband accompanied, assist with  interpreting. No CVAT. Abdomen appears mildly distended and firm. No rebound or radiation. External genitalia within normal limits. Bimanual exam, normal size uterus, no cervical motion tenderness, no right sided adnexal tenderness, mild tenderness on left. Normal rectal exam, sphincter tone normal.  Urinalysis: Negative leukocytes, negative blood, negative nitrites, 0-5 WBCs, 10-20 squamous epithelials, moderate bacteria.  Positive UPT  Ultrasound: Anteverted uterus with thickened endometrium with fluid filled focus 11x6x41mm. questionable gestational sac,  Second fluid area 9x6x2mm. Posterior intramural fibroid 52x32x77mm. Right ovary corpus luteal cyst 20x46mm. Left ovary normal. Cervix long closed. Negative CDS  Early pregnancy - [redacted]w[redacted]d -   Plan: Reviewed options given Miralax sample to take as needed. Will schedule [place] in 3 weeks for viability/dating. Counseled on starting OB care after next [place]. Given information on first trimester pregnancy,including start prenatal vitamins, avoid tuna and deli meat, no alcohol. Congratulations given on pregnancy. Urine culture pending.

## 2017-07-08 ENCOUNTER — Other Ambulatory Visit: Payer: Self-pay | Admitting: Women's Health

## 2017-07-08 DIAGNOSIS — Z3491 Encounter for supervision of normal pregnancy, unspecified, first trimester: Secondary | ICD-10-CM

## 2017-07-08 DIAGNOSIS — N831 Corpus luteum cyst of ovary, unspecified side: Secondary | ICD-10-CM

## 2017-07-08 DIAGNOSIS — R1030 Lower abdominal pain, unspecified: Secondary | ICD-10-CM

## 2017-07-10 ENCOUNTER — Other Ambulatory Visit: Payer: Self-pay | Admitting: Physician Assistant

## 2017-07-10 DIAGNOSIS — R1013 Epigastric pain: Secondary | ICD-10-CM

## 2017-07-10 DIAGNOSIS — A048 Other specified bacterial intestinal infections: Secondary | ICD-10-CM

## 2017-07-11 LAB — URINE CULTURE
MICRO NUMBER:: 90160182
SPECIMEN QUALITY:: ADEQUATE

## 2017-07-11 LAB — URINALYSIS, COMPLETE W/RFL CULTURE
Bilirubin Urine: NEGATIVE
Glucose, UA: NEGATIVE
HYALINE CAST: NONE SEEN /LPF
Hgb urine dipstick: NEGATIVE
Ketones, ur: NEGATIVE
LEUKOCYTE ESTERASE: NEGATIVE
Nitrites, Initial: NEGATIVE
PROTEIN: NEGATIVE
RBC / HPF: NONE SEEN /HPF (ref 0–2)
Specific Gravity, Urine: 1.029 (ref 1.001–1.03)
pH: 5.5 (ref 5.0–8.0)

## 2017-07-11 LAB — CULTURE INDICATED

## 2017-07-17 ENCOUNTER — Telehealth: Payer: Self-pay | Admitting: *Deleted

## 2017-07-17 NOTE — Telephone Encounter (Signed)
Spoke with patient husband

## 2017-07-17 NOTE — Telephone Encounter (Signed)
(  you are back up md) Pt husband called stating patient has been c/o dizziness. 5 weeks 3 days pregnant,had visit with nancy on 07/07/17, ultrasound viability/dating scheduled on 07/27/17. Pt husband said pt blood pressure 2 days ago was 86/88 has history of low blood pressure, states only feel dizzy when up moving around, sitting no dizziness, has nausea off and on, no vaginal bleeding or pain, slight cramping off/on. Pt asked what to do regarding dizziness? Please advise

## 2017-07-17 NOTE — Telephone Encounter (Signed)
She needs to push the fluids +++.  Drink more water, Pedialyte if needed.  Vitamin B6 OTC for nausea.

## 2017-07-27 ENCOUNTER — Ambulatory Visit (INDEPENDENT_AMBULATORY_CARE_PROVIDER_SITE_OTHER): Payer: 59 | Admitting: Women's Health

## 2017-07-27 ENCOUNTER — Other Ambulatory Visit: Payer: 59

## 2017-07-27 ENCOUNTER — Other Ambulatory Visit: Payer: Self-pay | Admitting: Women's Health

## 2017-07-27 ENCOUNTER — Encounter: Payer: Self-pay | Admitting: Women's Health

## 2017-07-27 ENCOUNTER — Ambulatory Visit: Payer: Self-pay | Admitting: Women's Health

## 2017-07-27 ENCOUNTER — Ambulatory Visit (INDEPENDENT_AMBULATORY_CARE_PROVIDER_SITE_OTHER): Payer: 59

## 2017-07-27 VITALS — BP 122/82

## 2017-07-27 DIAGNOSIS — N912 Amenorrhea, unspecified: Secondary | ICD-10-CM | POA: Diagnosis not present

## 2017-07-27 DIAGNOSIS — O468X1 Other antepartum hemorrhage, first trimester: Secondary | ICD-10-CM

## 2017-07-27 DIAGNOSIS — O3680X1 Pregnancy with inconclusive fetal viability, fetus 1: Secondary | ICD-10-CM

## 2017-07-27 DIAGNOSIS — O418X10 Other specified disorders of amniotic fluid and membranes, first trimester, not applicable or unspecified: Secondary | ICD-10-CM

## 2017-07-27 DIAGNOSIS — O3680X0 Pregnancy with inconclusive fetal viability, not applicable or unspecified: Secondary | ICD-10-CM

## 2017-07-27 MED ORDER — METOCLOPRAMIDE HCL 10 MG PO TABS: 10.0000 mg | ORAL_TABLET | Freq: Four times a day (QID) | ORAL | 1 refills | Status: DC

## 2017-07-27 NOTE — Patient Instructions (Addendum)
b6 with unisom  First Trimester of Pregnancy The first trimester of pregnancy is from week 1 until the end of week 13 (months 1 through 3). During this time, your baby will begin to develop inside you. At 6-8 weeks, the eyes and face are formed, and the heartbeat can be seen on ultrasound. At the end of 12 weeks, all the baby's organs are formed. Prenatal care is all the medical care you receive before the birth of your baby. Make sure you get good prenatal care and follow all of your doctor's instructions. Follow these instructions at home: Medicines  Take over-the-counter and prescription medicines only as told by your doctor. Some medicines are safe and some medicines are not safe during pregnancy.  Take a prenatal vitamin that contains at least 600 micrograms (mcg) of folic acid.  If you have trouble pooping (constipation), take medicine that will make your stool soft (stool softener) if your doctor approves. Eating and drinking  Eat regular, healthy meals.  Your doctor will tell you the amount of weight gain that is right for you.  Avoid raw meat and uncooked cheese.  If you feel sick to your stomach (nauseous) or throw up (vomit): ? Eat 4 or 5 small meals a day instead of 3 large meals. ? Try eating a few soda crackers. ? Drink liquids between meals instead of during meals.  To prevent constipation: ? Eat foods that are high in fiber, like fresh fruits and vegetables, whole grains, and beans. ? Drink enough fluids to keep your pee (urine) clear or pale yellow. Activity  Exercise only as told by your doctor. Stop exercising if you have cramps or pain in your lower belly (abdomen) or low back.  Do not exercise if it is too hot, too humid, or if you are in a place of great height (high altitude).  Try to avoid standing for long periods of time. Move your legs often if you must stand in one place for a long time.  Avoid heavy lifting.  Wear low-heeled shoes. Sit and stand up  straight.  You can have sex unless your doctor tells you not to. Relieving pain and discomfort  Wear a good support bra if your breasts are sore.  Take warm water baths (sitz baths) to soothe pain or discomfort caused by hemorrhoids. Use hemorrhoid cream if your doctor says it is okay.  Rest with your legs raised if you have leg cramps or low back pain.  If you have puffy, bulging veins (varicose veins) in your legs: ? Wear support hose or compression stockings as told by your doctor. ? Raise (elevate) your feet for 15 minutes, 3-4 times a day. ? Limit salt in your food. Prenatal care  Schedule your prenatal visits by the twelfth week of pregnancy.  Write down your questions. Take them to your prenatal visits.  Keep all your prenatal visits as told by your doctor. This is important. Safety  Wear your seat belt at all times when driving.  Make a list of emergency phone numbers. The list should include numbers for family, friends, the hospital, and police and fire departments. General instructions  Ask your doctor for a referral to a local prenatal class. Begin classes no later than at the start of month 6 of your pregnancy.  Ask for help if you need counseling or if you need help with nutrition. Your doctor can give you advice or tell you where to go for help.  Do not use hot  tubs, steam rooms, or saunas.  Do not douche or use tampons or scented sanitary pads.  Do not cross your legs for long periods of time.  Avoid all herbs and alcohol. Avoid drugs that are not approved by your doctor.  Do not use any tobacco products, including cigarettes, chewing tobacco, and electronic cigarettes. If you need help quitting, ask your doctor. You may get counseling or other support to help you quit.  Avoid cat litter boxes and soil used by cats. These carry germs that can cause birth defects in the baby and can cause a loss of your baby (miscarriage) or stillbirth.  Visit your dentist.  At home, brush your teeth with a soft toothbrush. Be gentle when you floss. Contact a doctor if:  You are dizzy.  You have mild cramps or pressure in your lower belly.  You have a nagging pain in your belly area.  You continue to feel sick to your stomach, you throw up, or you have watery poop (diarrhea).  You have a bad smelling fluid coming from your vagina.  You have pain when you pee (urinate).  You have increased puffiness (swelling) in your face, hands, legs, or ankles. Get help right away if:  You have a fever.  You are leaking fluid from your vagina.  You have spotting or bleeding from your vagina.  You have very bad belly cramping or pain.  You gain or lose weight rapidly.  You throw up blood. It may look like coffee grounds.  You are around people who have [place] measles, fifth disease, or chickenpox.  You have a very bad headache.  You have shortness of breath.  You have any kind of trauma, such as from a fall or a car accident. Summary  The first trimester of pregnancy is from week 1 until the end of week 13 (months 1 through 3).  To take care of yourself and your unborn baby, you will need to eat healthy meals, take medicines only if your doctor tells you to do so, and do activities that are safe for you and your baby.  Keep all follow-up visits as told by your doctor. This is important as your doctor will have to ensure that your baby is healthy and growing well. This information is not intended to replace advice given to you by your health care provider. Make sure you discuss any questions you have with your health care provider. Document Released: 11/05/2007 Document Revised: 05/27/2016 Document Reviewed: 05/27/2016 Elsevier Interactive Patient Education  2017 [employer].

## 2017-07-27 NOTE — Progress Notes (Signed)
32 year old MWF presents for viability/dating ultrasound. Denies vaginal discharge, spotting, abdominal pain, urinary symptoms or fever. Having frequent nausea, vomiting 2-3 times daily. Had increased vomiting with just plain B6. Happy with pregnancy. Husband accompanies. Works at [employer], no heavy lifting.  Exam: Appears well. Ultrasound: T/V anteverted uterus with living IUP seen in fundus. CRL [redacted]w[redacted]d, LMP date EGA [redacted]w[redacted]d. FHR 169 bpm. Cervix long and closed. Intramural posterior fibroid 51 x 37 mm. Left subchorionic hematoma seen 31 x 27 x 33 mm. Right ovary corpus luteal cyst 31 x 27 mm. Left ovary thick-walled follicle 20 x 17 mm. Negative cul-de-sac.  First trimester pregnancy Fibroid uterus Nausea with vomiting first trimester  Plan: Ultrasound findings reviewed, copy of ultrasound report given instructed to schedule new OB appointment. Pelvic rest, call or return if any bleeding. Reglan 10 mg before meals and at at bedtime. Could also try B6 with Unisom at bedtime. Nausea prevention discussed. Continue prenatal vitamin daily, safe pregnancy behaviors reviewed. Congratulations given.

## 2017-08-10 LAB — OB RESULTS CONSOLE ABO/RH: RH Type: POSITIVE

## 2017-08-10 LAB — OB RESULTS CONSOLE RUBELLA ANTIBODY, IGM: RUBELLA: IMMUNE

## 2017-08-10 LAB — OB RESULTS CONSOLE GC/CHLAMYDIA
CHLAMYDIA, DNA PROBE: NEGATIVE
GC PROBE AMP, GENITAL: NEGATIVE

## 2017-08-10 LAB — OB RESULTS CONSOLE ANTIBODY SCREEN: Antibody Screen: NEGATIVE

## 2017-08-10 LAB — OB RESULTS CONSOLE HEPATITIS B SURFACE ANTIGEN: Hepatitis B Surface Ag: NEGATIVE

## 2017-08-10 LAB — OB RESULTS CONSOLE HIV ANTIBODY (ROUTINE TESTING): HIV: NONREACTIVE

## 2017-08-10 LAB — OB RESULTS CONSOLE RPR: RPR: NONREACTIVE

## 2018-02-09 LAB — OB RESULTS CONSOLE GBS: STREP GROUP B AG: NEGATIVE

## 2018-02-12 ENCOUNTER — Other Ambulatory Visit: Payer: Self-pay | Admitting: Obstetrics & Gynecology

## 2018-02-16 ENCOUNTER — Encounter (HOSPITAL_COMMUNITY): Payer: Self-pay | Admitting: *Deleted

## 2018-02-16 ENCOUNTER — Telehealth (HOSPITAL_COMMUNITY): Payer: Self-pay | Admitting: *Deleted

## 2018-02-16 NOTE — Telephone Encounter (Signed)
Preadmission screen  

## 2018-02-17 ENCOUNTER — Telehealth (HOSPITAL_COMMUNITY): Payer: Self-pay | Admitting: *Deleted

## 2018-02-17 NOTE — Telephone Encounter (Signed)
Preadmission screen  

## 2018-02-18 ENCOUNTER — Encounter (HOSPITAL_COMMUNITY): Payer: Self-pay

## 2018-02-23 ENCOUNTER — Observation Stay (HOSPITAL_COMMUNITY)
Admission: RE | Admit: 2018-02-23 | Discharge: 2018-02-23 | Disposition: A | Discharge status: Home / Self Care | Payer: 59 | Source: Ambulatory Visit | Attending: Obstetrics & Gynecology | Admitting: Obstetrics & Gynecology

## 2018-02-23 ENCOUNTER — Encounter (HOSPITAL_COMMUNITY): Payer: Self-pay

## 2018-02-23 DIAGNOSIS — O321XX0 Maternal care for breech presentation, not applicable or unspecified: Principal | ICD-10-CM | POA: Insufficient documentation

## 2018-02-23 DIAGNOSIS — O99343 Other mental disorders complicating pregnancy, third trimester: Secondary | ICD-10-CM | POA: Diagnosis not present

## 2018-02-23 DIAGNOSIS — Z8249 Family history of ischemic heart disease and other diseases of the circulatory system: Secondary | ICD-10-CM | POA: Diagnosis not present

## 2018-02-23 DIAGNOSIS — Z8349 Family history of other endocrine, nutritional and metabolic diseases: Secondary | ICD-10-CM | POA: Diagnosis not present

## 2018-02-23 DIAGNOSIS — Z3A37 37 weeks gestation of pregnancy: Secondary | ICD-10-CM | POA: Diagnosis not present

## 2018-02-23 DIAGNOSIS — F329 Major depressive disorder, single episode, unspecified: Secondary | ICD-10-CM | POA: Diagnosis not present

## 2018-02-23 MED ORDER — TERBUTALINE SULFATE 1 MG/ML IJ SOLN
0.2500 mg | Freq: Once | INTRAMUSCULAR | Status: AC
  Administered 2018-02-23: 0.25 mg via SUBCUTANEOUS

## 2018-02-23 MED ORDER — TERBUTALINE SULFATE 1 MG/ML IJ SOLN
INTRAMUSCULAR | Status: AC
  Filled 2018-02-23: qty 1

## 2018-02-23 NOTE — Discharge Instructions (Signed)

## 2018-02-23 NOTE — Progress Notes (Signed)
Procedure Note: 5/85/92 External cephalic version.   Diagnosis- Persistent Breech presentation, oligohydramnios, 37.6 weeks  Post-procedure diagnosis: Same, Failed version.  Surgeon: Dr Manon Hilding Assistant: CNM Sigmon  Anesthesia: None  Pre-procedure bedside sono confirmed breech presentation. FHT -reactive. No UCs noted.  Terbutaline 0.25mg  SQ given.   External cephalic version attempted with intermittently checking FHTs. We tried 4 attempts with controlled pressure to move baby forward flip and backward with no success. Maternal discomfort was significant with no change in head position past LUQ from RUQ.   Procedure stopped.  Post-procedure- FHT for 1 hour advised.   Anticipate discharge home if no new concerns on FHT or maternal health.   V.Herley Bernardini MD

## 2018-02-23 NOTE — H&P (Signed)
Alyssa Kaiser is a 32 y.o. female presenting for external cephalic version at 75.1 weeks  Pt has persistent breech since 34 wks with oligohydramnios. Increased risk of failure of version reviewed and she requests me to try to turn.  Uncomplicated pregnancy  OB History    Gravida  1   Para  0   Term  0   Preterm  0   AB  0   Living  0     SAB  0   TAB  0   Ectopic  0   Multiple  0   Live Births  0          Past Medical History:  Diagnosis Date  . Depression    Past Surgical History:  Procedure Laterality Date  . APPENDECTOMY     Family History: family history includes Hypertension in her mother; Thyroid disease in her mother. Social History:  reports that she has never smoked. She has never used smokeless tobacco. She reports that she does not drink alcohol or use drugs.        Blood pressure 116/64, pulse 65, temperature (!) 97.5 F (36.4 C), temperature source Axillary, resp. rate 16, height 5\' 6"  (1.676 m), weight 72 kg, last menstrual period 05/29/2017. Exam Physical Exam  NAD A&O x3 Abdomen soft gravid uterus. Breech persists. AFI visually low (5 cm per office sono yesterday)  Prenatal labs: ABO, Rh: A/Positive/-- (03/11 0000) Antibody: Negative (03/11 0000) Rubella: Immune (03/11 0000) RPR: Nonreactive (03/11 0000)  HBsAg: Negative (03/11 0000)  HIV: Non-reactive (03/11 0000)  GBS: Negative (09/10 0000)   Assessment/Plan: G1, 37.6 wks, breech. Here for version Informed written consent, NPO.    Elveria Royals 02/23/2018, 9:22 AM

## 2018-02-26 NOTE — Patient Instructions (Signed)
Alyssa Kaiser  02/26/2018   Your procedure is scheduled on:  03/03/2018  Enter through the Main Entrance of [facility] at [place] up the phone at the desk and dial [phone]  Call this number if you have problems the morning of surgery:[phone]  Remember:   Do not eat food:(After Midnight) Desps de medianoche.  Do not drink clear liquids: (After Midnight) Desps de medianoche.  Take these medicines the morning of surgery with A SIP OF WATER: none   Do not wear jewelry, make-up or nail polish.  Do not wear lotions, powders, or perfumes. Do not wear deodorant.  Do not shave 48 hours prior to surgery.  Do not bring valuables to the hospital.  [facility] is not   responsible for any belongings or valuables brought to the hospital.  Contacts, dentures or bridgework may not be worn into surgery.  Leave suitcase in the car. After surgery it may be brought to your room.  For patients admitted to the hospital, checkout time is 11:00 AM the day of              discharge.    N/A   Please read over the following fact sheets that you were given:   Surgical Site Infection Prevention

## 2018-03-02 ENCOUNTER — Encounter (HOSPITAL_COMMUNITY): Payer: Self-pay | Admitting: Anesthesiology

## 2018-03-02 ENCOUNTER — Encounter (HOSPITAL_COMMUNITY)
Admission: RE | Admit: 2018-03-02 | Discharge: 2018-03-02 | Disposition: A | Discharge status: Home / Self Care | Payer: 59 | Source: Ambulatory Visit | Attending: Obstetrics & Gynecology | Admitting: Obstetrics & Gynecology

## 2018-03-02 LAB — CBC
HEMATOCRIT: 35.8 % — AB (ref 36.0–46.0)
Hemoglobin: 11.9 g/dL — ABNORMAL LOW (ref 12.0–15.0)
MCH: 32.5 pg (ref 26.0–34.0)
MCHC: 33.2 g/dL (ref 30.0–36.0)
MCV: 97.8 fL (ref 78.0–100.0)
Platelets: 243 10*3/uL (ref 150–400)
RBC: 3.66 MIL/uL — ABNORMAL LOW (ref 3.87–5.11)
RDW: 13.7 % (ref 11.5–15.5)
WBC: 8.8 10*3/uL (ref 4.0–10.5)

## 2018-03-02 LAB — TYPE AND SCREEN
ABO/RH(D): A POS
Antibody Screen: NEGATIVE

## 2018-03-02 LAB — ABO/RH: ABO/RH(D): A POS

## 2018-03-02 NOTE — Anesthesia Preprocedure Evaluation (Addendum)
Anesthesia Evaluation  Patient identified by MRN, date of birth, ID band Patient awake    Reviewed: Allergy & Precautions, NPO status , Patient's Chart, lab work & pertinent test results  Airway Mallampati: II  TM Distance: >3 FB Neck ROM: Full    Dental  (+) Teeth Intact   Pulmonary neg pulmonary ROS,    Pulmonary exam normal breath sounds clear to auscultation       Cardiovascular negative cardio ROS   Rhythm:Regular Rate:Normal     Neuro/Psych PSYCHIATRIC DISORDERS Depression negative neurological ROS     GI/Hepatic negative GI ROS, Neg liver ROS,   Endo/Other  negative endocrine ROS  Renal/GU negative Renal ROS  negative genitourinary   Musculoskeletal negative musculoskeletal ROS (+)   Abdominal   Peds  Hematology  (+) anemia ,   Anesthesia Other Findings   Reproductive/Obstetrics (+) Pregnancy Term pregnancy Breech presentation                            Anesthesia Physical Anesthesia Plan  ASA: II  Anesthesia Plan: Spinal   Post-op Pain Management:    Induction:   PONV Risk Score and Plan: 4 or greater and Scopolamine patch - Pre-op, Ondansetron, Dexamethasone and Treatment may vary due to age or medical condition  Airway Management Planned: Natural Airway  Additional Equipment:   Intra-op Plan:   Post-operative Plan:   Informed Consent: I have reviewed the patients History and Physical, chart, labs and discussed the procedure including the risks, benefits and alternatives for the proposed anesthesia with the patient or authorized representative who has indicated his/her understanding and acceptance.   Dental advisory given  Plan Discussed with: CRNA and Surgeon  Anesthesia Plan Comments:        Anesthesia Quick Evaluation

## 2018-03-02 NOTE — H&P (Signed)
Alyssa Kaiser is a 32 y.o. female presenting for primary C-section for persistent breech. Failed version. Oligohydramnios, reactive wkly NST since 36 wks. PNCourse- low wt gain but fetal growth AGA  OB History    Gravida  1   Para  0   Term  0   Preterm  0   AB  0   Living  0     SAB  0   TAB  0   Ectopic  0   Multiple  0   Live Births  0          Past Medical History:  Diagnosis Date  . Depression    Past Surgical History:  Procedure Laterality Date  . APPENDECTOMY     Family History: family history includes Hypertension in her mother; Thyroid disease in her mother. Social History:  reports that she has never smoked. She has never used smokeless tobacco. She reports that she does not drink alcohol or use drugs.     Maternal Diabetes: No Genetic Screening: Normal Maternal Ultrasounds/Referrals: Normal but oligohydramnios since 35 wks Fetal Ultrasounds or other Referrals:  None Maternal Substance Abuse:  No Significant Maternal Medications:  None Significant Maternal Lab Results:  None Other Comments:  None  ROS  Ng  History   Last menstrual period 05/29/2017. Exam Physical Exam  Physical exam:  A&O x 3, no acute distress. Pleasant HEENT neg, no thyromegaly Lungs CTA bilat CV RRR, S1S2 normal Abdo soft, non tender, non acute Extr no edema/ tenderness Pelvic  FHT   Toco  Prenatal labs: ABO, Rh: --/--/A POS, A POS Performed at [facility], [address] [place],  [address]  [phone]/01 1110) Antibody: NEG (10/01 1110) Rubella: Immune (03/11 0000) RPR: Nonreactive (03/11 0000)  HBsAg: Negative (03/11 0000)  HIV: Non-reactive (03/11 0000)  GBS: Negative (09/10 0000)   Assessment/Plan: 32 yo G1, 39 wks, Breech, oligphydramnios Here for C/section Risks/complications of surgery reviewed incl infection, bleeding, damage to internal organs including bladder, bowels, ureters, blood vessels, other risks from anesthesia, VTE and delayed  complications of any surgery, complications in future surgery reviewed. Also discussed neonatal complications incl difficult delivery, laceration, vacuum assistance, TTN etc. Pt understands and agrees, all concerns addressed.    Alyssa Kaiser

## 2018-03-03 ENCOUNTER — Encounter (HOSPITAL_COMMUNITY)
Admission: AD | Disposition: A | Discharge status: Home / Self Care | Payer: Self-pay | Source: Home / Self Care | Attending: Obstetrics & Gynecology

## 2018-03-03 ENCOUNTER — Inpatient Hospital Stay (HOSPITAL_COMMUNITY)
Admission: AD | Admit: 2018-03-03 | Discharge: 2018-03-06 | DRG: 787 | Disposition: A | Discharge status: Home / Self Care | Payer: 59 | Attending: Obstetrics & Gynecology | Admitting: Obstetrics & Gynecology

## 2018-03-03 ENCOUNTER — Other Ambulatory Visit: Payer: Self-pay

## 2018-03-03 ENCOUNTER — Inpatient Hospital Stay (HOSPITAL_COMMUNITY): Payer: 59 | Admitting: Anesthesiology

## 2018-03-03 ENCOUNTER — Encounter (HOSPITAL_COMMUNITY): Payer: Self-pay

## 2018-03-03 DIAGNOSIS — D251 Intramural leiomyoma of uterus: Secondary | ICD-10-CM | POA: Diagnosis present

## 2018-03-03 DIAGNOSIS — O4103X0 Oligohydramnios, third trimester, not applicable or unspecified: Secondary | ICD-10-CM | POA: Diagnosis present

## 2018-03-03 DIAGNOSIS — Z23 Encounter for immunization: Secondary | ICD-10-CM

## 2018-03-03 DIAGNOSIS — Z3A39 39 weeks gestation of pregnancy: Secondary | ICD-10-CM

## 2018-03-03 DIAGNOSIS — O3413 Maternal care for benign tumor of corpus uteri, third trimester: Secondary | ICD-10-CM | POA: Diagnosis present

## 2018-03-03 DIAGNOSIS — O321XX0 Maternal care for breech presentation, not applicable or unspecified: Principal | ICD-10-CM | POA: Diagnosis present

## 2018-03-03 DIAGNOSIS — Z98891 History of uterine scar from previous surgery: Secondary | ICD-10-CM

## 2018-03-03 LAB — RPR: RPR: NONREACTIVE

## 2018-03-03 SURGERY — Surgical Case
Anesthesia: Spinal

## 2018-03-03 MED ORDER — CEFAZOLIN SODIUM-DEXTROSE 2-4 GM/100ML-% IV SOLN
2.0000 g | INTRAVENOUS | Status: AC
  Administered 2018-03-03: 2 g via INTRAVENOUS
  Filled 2018-03-03: qty 100

## 2018-03-03 MED ORDER — OXYTOCIN 10 UNIT/ML IJ SOLN
INTRAVENOUS | Status: DC | PRN
  Administered 2018-03-03: 40 [IU] via INTRAVENOUS

## 2018-03-03 MED ORDER — LACTATED RINGERS IV SOLN
INTRAVENOUS | Status: DC | PRN
  Administered 2018-03-03: 09:00:00 via INTRAVENOUS

## 2018-03-03 MED ORDER — DIPHENHYDRAMINE HCL 25 MG PO CAPS
25.0000 mg | ORAL_CAPSULE | Freq: Four times a day (QID) | ORAL | Status: DC | PRN
  Administered 2018-03-03: 25 mg via ORAL
  Filled 2018-03-03: qty 1

## 2018-03-03 MED ORDER — NALBUPHINE HCL 10 MG/ML IJ SOLN: 5.0000 mg | Freq: Once | INTRAMUSCULAR | Status: DC | PRN

## 2018-03-03 MED ORDER — OXYCODONE-ACETAMINOPHEN 5-325 MG PO TABS
1.0000 | ORAL_TABLET | ORAL | Status: DC | PRN
  Administered 2018-03-04 – 2018-03-06 (×8): 1 via ORAL
  Filled 2018-03-03 (×8): qty 1

## 2018-03-03 MED ORDER — MENTHOL 3 MG MT LOZG: 1.0000 | LOZENGE | OROMUCOSAL | Status: DC | PRN

## 2018-03-03 MED ORDER — FENTANYL CITRATE (PF) 100 MCG/2ML IJ SOLN: 25.0000 ug | INTRAMUSCULAR | Status: DC | PRN

## 2018-03-03 MED ORDER — KETOROLAC TROMETHAMINE 30 MG/ML IJ SOLN: 30.0000 mg | Freq: Four times a day (QID) | INTRAMUSCULAR | Status: AC | PRN

## 2018-03-03 MED ORDER — FENTANYL CITRATE (PF) 100 MCG/2ML IJ SOLN
INTRAMUSCULAR | Status: AC
  Filled 2018-03-03: qty 2

## 2018-03-03 MED ORDER — ONDANSETRON HCL 4 MG/2ML IJ SOLN
INTRAMUSCULAR | Status: AC
  Filled 2018-03-03: qty 2

## 2018-03-03 MED ORDER — MEPERIDINE HCL 25 MG/ML IJ SOLN
INTRAMUSCULAR | Status: DC | PRN
  Administered 2018-03-03 (×2): 12.5 mg via INTRAVENOUS

## 2018-03-03 MED ORDER — MORPHINE SULFATE (PF) 0.5 MG/ML IJ SOLN
INTRAMUSCULAR | Status: DC | PRN
  Administered 2018-03-03: 150 ug via INTRATHECAL

## 2018-03-03 MED ORDER — LACTATED RINGERS IV SOLN
INTRAVENOUS | Status: DC
  Administered 2018-03-03 (×2): via INTRAVENOUS

## 2018-03-03 MED ORDER — METOCLOPRAMIDE HCL 5 MG/ML IJ SOLN
INTRAMUSCULAR | Status: DC | PRN
  Administered 2018-03-03: 10 mg via INTRAVENOUS

## 2018-03-03 MED ORDER — DIPHENHYDRAMINE HCL 25 MG PO CAPS
25.0000 mg | ORAL_CAPSULE | ORAL | Status: DC | PRN
  Filled 2018-03-03: qty 1

## 2018-03-03 MED ORDER — SIMETHICONE 80 MG PO CHEW
80.0000 mg | CHEWABLE_TABLET | ORAL | Status: DC
  Administered 2018-03-04 – 2018-03-06 (×3): 80 mg via ORAL
  Filled 2018-03-03 (×3): qty 1

## 2018-03-03 MED ORDER — LACTATED RINGERS IV SOLN: INTRAVENOUS | Status: DC

## 2018-03-03 MED ORDER — METOCLOPRAMIDE HCL 5 MG/ML IJ SOLN
INTRAMUSCULAR | Status: AC
  Filled 2018-03-03: qty 2

## 2018-03-03 MED ORDER — ACETAMINOPHEN 325 MG PO TABS
650.0000 mg | ORAL_TABLET | ORAL | Status: DC | PRN
  Administered 2018-03-03 – 2018-03-04 (×2): 650 mg via ORAL
  Filled 2018-03-03 (×2): qty 2

## 2018-03-03 MED ORDER — SODIUM CHLORIDE 0.9% FLUSH: 3.0000 mL | INTRAVENOUS | Status: DC | PRN

## 2018-03-03 MED ORDER — DIPHENHYDRAMINE HCL 50 MG/ML IJ SOLN: 12.5000 mg | INTRAMUSCULAR | Status: DC | PRN

## 2018-03-03 MED ORDER — FENTANYL CITRATE (PF) 100 MCG/2ML IJ SOLN
INTRAMUSCULAR | Status: DC | PRN
  Administered 2018-03-03: 15 ug via INTRATHECAL

## 2018-03-03 MED ORDER — MEPERIDINE HCL 25 MG/ML IJ SOLN
INTRAMUSCULAR | Status: AC
  Filled 2018-03-03: qty 1

## 2018-03-03 MED ORDER — PHENYLEPHRINE 8 MG IN D5W 100 ML (0.08MG/ML) PREMIX OPTIME
INJECTION | INTRAVENOUS | Status: AC
  Filled 2018-03-03: qty 100

## 2018-03-03 MED ORDER — FENTANYL CITRATE (PF) 100 MCG/2ML IJ SOLN
INTRAMUSCULAR | Status: AC
  Administered 2018-03-03: 50 ug
  Filled 2018-03-03: qty 2

## 2018-03-03 MED ORDER — PHENYLEPHRINE 8 MG IN D5W 100 ML (0.08MG/ML) PREMIX OPTIME
INJECTION | INTRAVENOUS | Status: DC | PRN
  Administered 2018-03-03: 60 ug/min via INTRAVENOUS

## 2018-03-03 MED ORDER — NALBUPHINE HCL 10 MG/ML IJ SOLN: 5.0000 mg | INTRAMUSCULAR | Status: DC | PRN

## 2018-03-03 MED ORDER — MEPERIDINE HCL 25 MG/ML IJ SOLN: 6.2500 mg | INTRAMUSCULAR | Status: DC | PRN

## 2018-03-03 MED ORDER — DEXAMETHASONE SODIUM PHOSPHATE 4 MG/ML IJ SOLN
INTRAMUSCULAR | Status: DC | PRN
  Administered 2018-03-03: 4 mg via INTRAVENOUS

## 2018-03-03 MED ORDER — MORPHINE SULFATE (PF) 0.5 MG/ML IJ SOLN
INTRAMUSCULAR | Status: AC
  Filled 2018-03-03: qty 10

## 2018-03-03 MED ORDER — SOD CITRATE-CITRIC ACID 500-334 MG/5ML PO SOLN
30.0000 mL | Freq: Once | ORAL | Status: AC
  Administered 2018-03-03: 30 mL via ORAL

## 2018-03-03 MED ORDER — BUPIVACAINE IN DEXTROSE 0.75-8.25 % IT SOLN
INTRATHECAL | Status: DC | PRN
  Administered 2018-03-03: .8 mL via INTRATHECAL
  Administered 2018-03-03: 1.6 mL via INTRATHECAL

## 2018-03-03 MED ORDER — INFLUENZA VAC SPLIT QUAD 0.5 ML IM SUSY: 0.5000 mL | PREFILLED_SYRINGE | INTRAMUSCULAR | Status: DC

## 2018-03-03 MED ORDER — DEXAMETHASONE SODIUM PHOSPHATE 4 MG/ML IJ SOLN
INTRAMUSCULAR | Status: AC
  Filled 2018-03-03: qty 1

## 2018-03-03 MED ORDER — IBUPROFEN 600 MG PO TABS
600.0000 mg | ORAL_TABLET | Freq: Four times a day (QID) | ORAL | Status: DC
  Administered 2018-03-03 – 2018-03-06 (×11): 600 mg via ORAL
  Filled 2018-03-03 (×11): qty 1

## 2018-03-03 MED ORDER — ZOLPIDEM TARTRATE 5 MG PO TABS: 5.0000 mg | ORAL_TABLET | Freq: Every evening | ORAL | Status: DC | PRN

## 2018-03-03 MED ORDER — SCOPOLAMINE 1 MG/3DAYS TD PT72
1.0000 | MEDICATED_PATCH | Freq: Once | TRANSDERMAL | Status: DC
  Administered 2018-03-03: 1.5 mg via TRANSDERMAL

## 2018-03-03 MED ORDER — LACTATED RINGERS IV SOLN
INTRAVENOUS | Status: DC
  Administered 2018-03-03 (×4): via INTRAVENOUS

## 2018-03-03 MED ORDER — NALOXONE HCL 0.4 MG/ML IJ SOLN: 0.4000 mg | INTRAMUSCULAR | Status: DC | PRN

## 2018-03-03 MED ORDER — OXYTOCIN 40 UNITS IN LACTATED RINGERS INFUSION - SIMPLE MED: 2.5000 [IU]/h | INTRAVENOUS | Status: AC

## 2018-03-03 MED ORDER — TETANUS-DIPHTH-ACELL PERTUSSIS 5-2.5-18.5 LF-MCG/0.5 IM SUSP: 0.5000 mL | Freq: Once | INTRAMUSCULAR | Status: DC

## 2018-03-03 MED ORDER — OXYCODONE-ACETAMINOPHEN 5-325 MG PO TABS
2.0000 | ORAL_TABLET | ORAL | Status: DC | PRN
  Administered 2018-03-05: 2 via ORAL
  Filled 2018-03-03: qty 2

## 2018-03-03 MED ORDER — NALOXONE HCL 4 MG/10ML IJ SOLN
1.0000 ug/kg/h | INTRAVENOUS | Status: DC | PRN
  Filled 2018-03-03: qty 5

## 2018-03-03 MED ORDER — SIMETHICONE 80 MG PO CHEW
80.0000 mg | CHEWABLE_TABLET | Freq: Three times a day (TID) | ORAL | Status: DC
  Administered 2018-03-03 – 2018-03-06 (×8): 80 mg via ORAL
  Filled 2018-03-03 (×8): qty 1

## 2018-03-03 MED ORDER — SCOPOLAMINE 1 MG/3DAYS TD PT72
MEDICATED_PATCH | TRANSDERMAL | Status: AC
  Filled 2018-03-03: qty 1

## 2018-03-03 MED ORDER — SENNOSIDES-DOCUSATE SODIUM 8.6-50 MG PO TABS
2.0000 | ORAL_TABLET | ORAL | Status: DC
  Administered 2018-03-04 – 2018-03-05 (×3): 2 via ORAL
  Filled 2018-03-03 (×3): qty 2

## 2018-03-03 MED ORDER — SIMETHICONE 80 MG PO CHEW: 80.0000 mg | CHEWABLE_TABLET | ORAL | Status: DC | PRN

## 2018-03-03 MED ORDER — COCONUT OIL OIL: 1.0000 "application " | TOPICAL_OIL | Status: DC | PRN

## 2018-03-03 MED ORDER — OXYTOCIN 10 UNIT/ML IJ SOLN
INTRAMUSCULAR | Status: AC
  Filled 2018-03-03: qty 4

## 2018-03-03 MED ORDER — PHENYLEPHRINE 40 MCG/ML (10ML) SYRINGE FOR IV PUSH (FOR BLOOD PRESSURE SUPPORT)
PREFILLED_SYRINGE | INTRAVENOUS | Status: AC
  Filled 2018-03-03: qty 10

## 2018-03-03 MED ORDER — ONDANSETRON HCL 4 MG/2ML IJ SOLN: 4.0000 mg | Freq: Three times a day (TID) | INTRAMUSCULAR | Status: DC | PRN

## 2018-03-03 MED ORDER — DIBUCAINE 1 % RE OINT: 1.0000 "application " | TOPICAL_OINTMENT | RECTAL | Status: DC | PRN

## 2018-03-03 MED ORDER — ONDANSETRON HCL 4 MG/2ML IJ SOLN
INTRAMUSCULAR | Status: DC | PRN
  Administered 2018-03-03: 4 mg via INTRAVENOUS

## 2018-03-03 MED ORDER — PRENATAL MULTIVITAMIN CH
1.0000 | ORAL_TABLET | Freq: Every day | ORAL | Status: DC
  Administered 2018-03-04 – 2018-03-06 (×3): 1 via ORAL
  Filled 2018-03-03 (×3): qty 1

## 2018-03-03 MED ORDER — SOD CITRATE-CITRIC ACID 500-334 MG/5ML PO SOLN
ORAL | Status: AC
  Filled 2018-03-03: qty 15

## 2018-03-03 MED ORDER — WITCH HAZEL-GLYCERIN EX PADS: 1.0000 "application " | MEDICATED_PAD | CUTANEOUS | Status: DC | PRN

## 2018-03-03 SURGICAL SUPPLY — 37 items
BENZOIN TINCTURE PRP APPL 2/3 (GAUZE/BANDAGES/DRESSINGS) ×3 IMPLANT
CHLORAPREP W/TINT 26ML (MISCELLANEOUS) ×3 IMPLANT
CLAMP CORD UMBIL (MISCELLANEOUS) IMPLANT
CLOSURE STERI STRIP 1/2 X4 (GAUZE/BANDAGES/DRESSINGS) ×3 IMPLANT
CLOSURE WOUND 1/2 X4 (GAUZE/BANDAGES/DRESSINGS)
CLOTH BEACON ORANGE TIMEOUT ST (SAFETY) ×3 IMPLANT
DRSG OPSITE POSTOP 4X10 (GAUZE/BANDAGES/DRESSINGS) ×3 IMPLANT
ELECT REM PT RETURN 9FT ADLT (ELECTROSURGICAL) ×3
ELECTRODE REM PT RTRN 9FT ADLT (ELECTROSURGICAL) ×1 IMPLANT
EXTRACTOR VACUUM KIWI (MISCELLANEOUS) IMPLANT
EXTRACTOR VACUUM M CUP 4 TUBE (SUCTIONS) IMPLANT
EXTRACTOR VACUUM M CUP 4' TUBE (SUCTIONS)
GLOVE BIO SURGEON STRL SZ7 (GLOVE) ×3 IMPLANT
GLOVE BIOGEL PI IND STRL 7.0 (GLOVE) ×2 IMPLANT
GLOVE BIOGEL PI INDICATOR 7.0 (GLOVE) ×4
GOWN STRL REUS W/TWL LRG LVL3 (GOWN DISPOSABLE) ×6 IMPLANT
KIT ABG SYR 3ML LUER SLIP (SYRINGE) IMPLANT
NEEDLE HYPO 25X5/8 SAFETYGLIDE (NEEDLE) IMPLANT
NS IRRIG 1000ML POUR BTL (IV SOLUTION) ×3 IMPLANT
PACK C SECTION WH (CUSTOM PROCEDURE TRAY) ×3 IMPLANT
PAD OB MATERNITY 4.3X12.25 (PERSONAL CARE ITEMS) ×3 IMPLANT
RTRCTR C-SECT PINK 25CM LRG (MISCELLANEOUS) IMPLANT
SPONGE LAP 18X18 RF (DISPOSABLE) ×9 IMPLANT
STRIP CLOSURE SKIN 1/2X4 (GAUZE/BANDAGES/DRESSINGS) IMPLANT
SUT MNCRL 0 VIOLET CTX 36 (SUTURE) ×2 IMPLANT
SUT MONOCRYL 0 CTX 36 (SUTURE) ×4
SUT PLAIN 0 NONE (SUTURE) IMPLANT
SUT PLAIN 2 0 (SUTURE)
SUT PLAIN ABS 2-0 CT1 27XMFL (SUTURE) IMPLANT
SUT VIC AB 0 CT1 27 (SUTURE) ×4
SUT VIC AB 0 CT1 27XBRD ANBCTR (SUTURE) ×2 IMPLANT
SUT VIC AB 2-0 CT1 27 (SUTURE) ×2
SUT VIC AB 2-0 CT1 TAPERPNT 27 (SUTURE) ×1 IMPLANT
SUT VIC AB 4-0 KS 27 (SUTURE) ×3 IMPLANT
SUT VICRYL 0 TIES 12 18 (SUTURE) IMPLANT
TOWEL OR 17X24 6PK STRL BLUE (TOWEL DISPOSABLE) ×3 IMPLANT
TRAY FOLEY W/BAG SLVR 14FR LF (SET/KITS/TRAYS/PACK) IMPLANT

## 2018-03-03 NOTE — Addendum Note (Signed)
Addendum  created 03/03/18 1231 by Flossie Dibble, CRNA   Sign clinical note

## 2018-03-03 NOTE — Anesthesia Procedure Notes (Signed)
Spinal  Patient location during procedure: OR Start time: 03/03/2018 7:49 AM Staffing Anesthesiologist: Brennan Bailey, MD Performed: anesthesiologist  Preanesthetic Checklist Completed: patient identified, surgical consent, pre-op evaluation, timeout performed, IV checked, risks and benefits discussed and monitors and equipment checked Spinal Block Patient position: sitting Prep: site prepped and draped and DuraPrep Patient monitoring: cardiac monitor, continuous pulse ox and blood pressure Approach: midline Location: L3-4 Injection technique: single-shot Needle Needle type: Pencan  Needle gauge: 24 G Needle length: 9 cm Needle insertion depth: 7 cm Assessment Sensory level: T10 Additional Notes Functioning IV was confirmed and monitors were applied. Sterile prep and drape, including hand hygiene and sterile gloves were used. The patient was positioned and the spine was prepped. The skin was anesthetized with lidocaine.  Free flow of clear CSF was obtained prior to injecting local anesthetic into the CSF.  The spinal needle aspirated freely following injection.  The needle was carefully withdrawn.  The patient tolerated the procedure well.   Pt placed supine immediately after injection. Lumbar level after 3 minutes. Pt placed in trendelenburg. Highest level T10 at 15 minutes. Elected to repeat spinal. See second procedure note.

## 2018-03-03 NOTE — Transfer of Care (Signed)
Immediate Anesthesia Transfer of Care Note  Patient: Alyssa Kaiser  Procedure(s) Performed: Primary CESAREAN SECTION (N/A )  Patient Location: PACU  Anesthesia Type:Spinal  Level of Consciousness: awake, alert  and oriented  Airway & Oxygen Therapy: Patient Spontanous Breathing  Post-op Assessment: Report given to RN and Post -op Vital signs reviewed and stable  Post vital signs: Reviewed and stable  Last Vitals:  Vitals Value Taken Time  BP 103/84 03/03/2018  9:38 AM  Temp 36.4 C 03/03/2018  9:38 AM  Pulse 66 03/03/2018  9:42 AM  Resp 18 03/03/2018  9:42 AM  SpO2 100 % 03/03/2018  9:42 AM  Vitals shown include unvalidated device data.  Last Pain:  Vitals:   03/03/18 0938  TempSrc: Oral         Complications: No apparent anesthesia complications

## 2018-03-03 NOTE — Anesthesia Procedure Notes (Signed)
Spinal  Patient location during procedure: OR Start time: 03/03/2018 8:20 AM Staffing Anesthesiologist: Josephine Igo, MD Performed: anesthesiologist  Preanesthetic Checklist Completed: patient identified, site marked, surgical consent, pre-op evaluation, timeout performed, IV checked, risks and benefits discussed and monitors and equipment checked Spinal Block Patient position: sitting Prep: DuraPrep Patient monitoring: heart rate, cardiac monitor, continuous pulse ox and blood pressure Approach: midline Location: L3-4 Injection technique: single-shot Needle Needle type: Pencan  Needle gauge: 24 G Needle length: 9 cm Needle insertion depth: 6 cm Assessment Sensory level: T4 Additional Notes Patient tolerated procedure well. Adequate sensory level.

## 2018-03-03 NOTE — Anesthesia Postprocedure Evaluation (Signed)
Anesthesia Post Note  Patient: Alyssa Kaiser  Procedure(s) Performed: Primary CESAREAN SECTION (N/A )     Patient location during evaluation: PACU Anesthesia Type: Spinal Level of consciousness: oriented and awake and alert Pain management: pain level controlled Vital Signs Assessment: post-procedure vital signs reviewed and stable Respiratory status: spontaneous breathing, respiratory function stable and nonlabored ventilation Cardiovascular status: blood pressure returned to baseline and stable Postop Assessment: no headache, no backache, no apparent nausea or vomiting, patient able to bend at knees and spinal receding Anesthetic complications: no    Last Vitals:  Vitals:   03/03/18 1100 03/03/18 1130  BP: 116/79 112/68  Pulse: (!) 53 (!) 55  Resp: 15 18  Temp:    SpO2: 100% 98%    Last Pain:  Vitals:   03/03/18 1130  TempSrc:   PainSc: Asleep   Pain Goal:                 Ayame Rena A.

## 2018-03-03 NOTE — Op Note (Signed)
Cesarean Section Procedure Note   Alyssa Kaiser  03/03/2018  Indications: Breech Presentation  Failed external cephalic version at 37 wks.   Pre-operative Diagnosis: Breech Presentation. 39 wks.   Post-operative Diagnosis: Same   Surgeon: Azucena Fallen, MD - Primary    Assistants: Lars Pinks CNM  Anesthesia: spinal (had to repeat due to inadequate level with 1st)  Procedure Details:  The patient was seen in the Holding Room. The risks, benefits, complications, treatment options, and expected outcomes were discussed with the patient. The patient concurred with the proposed plan, giving informed consent. identified as Alyssa Kaiser and the procedure verified as C-Section Delivery. A Time Out was held and the above information confirmed. 2 gm Ancef.  After induction of anesthesia, the patient was draped and prepped in the usual sterile manner, foley placed.  A Pfannenstiel incision was made and carried down through the subcutaneous tissue to the fascia. Fascial incision was made and extended transversely. The fascia was separated from the underlying rectus tissue superiorly and inferiorly. The peritoneum was identified and entered. Peritoneal incision was extended longitudinally. Alexis placed. The utero-vesical peritoneal reflection was incised transversely and the bladder flap was bluntly freed from the lower uterine segment. A low transverse uterine incision was made. No amniotic fluid noted. Baby GIRL was in [place] breech position and she was delivered with complete breech extraction gradually smoothly at 8.37 AM . Mouth nose suctioned and delayed cord clamping done at 1 minute. Apgar scores of 8 at one minute and 9 at five minutes. Cord ph was not sent. Cord blood was obtained for evaluation. The placenta was removed Intact and appeared normal. The uterine outline, tubes and ovaries appeared normal}. The uterine incision was closed with running locked sutures of 0Monocryl followed by another  imbricating layer. Hemostasis was observed. Alexis retractor removed. Peritoneum closed with 2-0 Vicryl. Muscles in lower half approximated with 2-0 Vicryl.  The fascia was then reapproximated with running sutures of 0Vicryl. The subcuticular closure was performed using 2-0plain gut. The skin was closed with 4-0Vicryl.   Instrument, sponge, and needle counts were correct prior the abdominal closure and were correct at the conclusion of the case.    Findings: Female infant in [place] breech position, delivered by complete breech extraction. Normal uterus, tubes, ovaries, Low transverse hysterotomy, 2 layer closure.    Estimated Blood Loss: 129 mL   Total IV Fluids: 1200 ml   Urine Output: 150 cc   Specimens: cord blood   Complications: no complications  Disposition: PACU - hemodynamically stable.   Maternal Condition: stable   Baby condition / location:  Couplet care / Skin to Skin  Attending Attestation: I performed the procedure.   Signed: Surgeon(s): Azucena Fallen, MD

## 2018-03-03 NOTE — Anesthesia Postprocedure Evaluation (Signed)
Anesthesia Post Note  Patient: Alyssa Kaiser  Procedure(s) Performed: Primary CESAREAN SECTION (N/A )     Patient location during evaluation: Mother Baby Anesthesia Type: Spinal Level of consciousness: awake and alert and oriented Pain management: satisfactory to patient Vital Signs Assessment: post-procedure vital signs reviewed and stable Respiratory status: respiratory function stable Cardiovascular status: stable Postop Assessment: no headache, no backache, epidural receding, patient able to bend at knees, no signs of nausea or vomiting and adequate PO intake Anesthetic complications: no    Last Vitals:  Vitals:   03/03/18 1130 03/03/18 1149  BP: 112/68 121/75  Pulse: (!) 55 (!) 56  Resp: 18 15  Temp:  36.6 C  SpO2: 98% 99%    Last Pain:  Vitals:   03/03/18 1149  TempSrc: Oral  PainSc:    Pain Goal:                 Kieren Ricci

## 2018-03-03 NOTE — Progress Notes (Signed)
Mom prefers to wait several hours to remove foley so she has a chance to feel stronger.

## 2018-03-04 LAB — CBC
HCT: 33.2 % — ABNORMAL LOW (ref 36.0–46.0)
HEMOGLOBIN: 11.3 g/dL — AB (ref 12.0–15.0)
MCH: 32.6 pg (ref 26.0–34.0)
MCHC: 34 g/dL (ref 30.0–36.0)
MCV: 95.7 fL (ref 78.0–100.0)
Platelets: 219 10*3/uL (ref 150–400)
RBC: 3.47 MIL/uL — ABNORMAL LOW (ref 3.87–5.11)
RDW: 13.4 % (ref 11.5–15.5)
WBC: 13.1 10*3/uL — ABNORMAL HIGH (ref 4.0–10.5)

## 2018-03-04 MED ORDER — INFLUENZA VAC SPLIT QUAD 0.5 ML IM SUSY
0.5000 mL | PREFILLED_SYRINGE | INTRAMUSCULAR | Status: AC
  Administered 2018-03-06: 0.5 mL via INTRAMUSCULAR
  Filled 2018-03-04: qty 0.5

## 2018-03-04 NOTE — Progress Notes (Signed)
POD# 1, PCS breech  S: Pt notes pain controlled w/ po meds, minimal lochia, nl void, out of bed w/o dizziness or chest pain, tol reg po, no flatus. Pt is  breastfeeding  Vitals:   03/03/18 2055 03/03/18 2226 03/04/18 0126 03/04/18 0513  BP: (!) 101/58  107/61 112/66  Pulse: 65  62 (!) 57  Resp: 18  16 16   Temp: 98.2 F (36.8 C)  98.4 F (36.9 C) 98 F (36.7 C)  TempSrc: Axillary  Axillary   SpO2:  99% 100% 99%  Weight:      Height:        Gen: well appearing CV: RRR Pulm: CTAB Abd: soft, ND, approp tender, fundus below umbilicus, NT Inc: C/D/I, dressed LE: tr edema, NT  CBC    Component Value Date/Time   WBC 13.1 (H) 03/04/2018 0541   RBC 3.47 (L) 03/04/2018 0541   HGB 11.3 (L) 03/04/2018 0541   HGB 13.8 04/18/2013 1106   HCT 33.2 (L) 03/04/2018 0541   HCT 40.0 04/18/2013 1106   PLT 219 03/04/2018 0541   PLT 291 04/18/2013 1106   MCV 95.7 03/04/2018 0541   MCV 87.2 05/15/2015 1350   MCV 91 04/18/2013 1106   MCH 32.6 03/04/2018 0541   MCHC 34.0 03/04/2018 0541   RDW 13.4 03/04/2018 0541   RDW 12.4 04/18/2013 1106   LYMPHSABS 1,740 07/18/2016 0949   LYMPHSABS 1.6 04/18/2013 1106   MONOABS 420 07/18/2016 0949   MONOABS 0.6 04/18/2013 1106   EOSABS 120 07/18/2016 0949   EOSABS 0.1 04/18/2013 1106   BASOSABS 60 07/18/2016 0949   BASOSABS 0.1 04/18/2013 1106    A/P: POD#  1 s/p PCS - post-op. Doing well.   Ala Dach 03/04/2018 10:30 AM

## 2018-03-04 NOTE — Progress Notes (Signed)
Parent request formula to supplement breast feeding due to mother's fatigue and sore nipples. She does not wish to pump at this time. Parents have been informed of small tummy size of newborn, taught hand expression and understand the possible consequences of formula to the health of the infant. The possible consequences shared with patient include 1) Loss of confidence in breastfeeding 2) Engorgement 3) Allergic sensitization of baby(asthma/allergies) and 4) decreased milk supply for mother.After discussion of the above the mother decided to  supplement with formula.  The tool used to give formula supplement will be bottle with slow flow nipple given by dad.   Mother counseled to avoid artificial nipples because this practice may lead to latch difficulties,inadequate milk transfer and nipple soreness.  Mom stated that she's exhausted and would like baby feed and for mom to be able to get some rest.

## 2018-03-04 NOTE — Progress Notes (Signed)
MOB was referred for history of depression/anxiety. * Referral screened out by Clinical Social Worker because none of the following criteria appear to apply: ~ History of anxiety/depression during this pregnancy, or of post-partum depression following prior delivery. ~ Diagnosis of anxiety and/or depression within last 3 years OR * MOB's symptoms currently being treated with medication and/or therapy. Please contact the Clinical Social Worker if needs arise, by MOB request, or if MOB scores greater than 9/yes to question 10 on Edinburgh Postpartum Depression Screen.  Shatha Hooser, LCSW Clinical Social Worker  [facility]  [phone]  

## 2018-03-05 MED ORDER — BISACODYL 10 MG RE SUPP: 10.0000 mg | Freq: Every day | RECTAL | Status: DC | PRN

## 2018-03-05 NOTE — Progress Notes (Signed)
POD# 2, PCS breech  S: Pt notes pain controlled w/ po meds, minimal lochia, nl void, out of bed w/o dizziness or chest pain, tol reg po, no flatus. Pt is  breastfeeding  Vitals:   03/04/18 0513 03/04/18 1430 03/04/18 2226 03/05/18 0625  BP: 112/66 111/65 114/69 128/62  Pulse: (!) 57 62 65 72  Resp: 16  18 18   Temp: 98 F (36.7 C) 98.5 F (36.9 C) 98.2 F (36.8 C) 98.2 F (36.8 C)  TempSrc:   Oral   SpO2: 99%   100%  Weight:      Height:        Gen: well appearing CV: RRR Pulm: CTAB Abd: soft, ND, approp tender, fundus below umbilicus, NT Inc: C/D/I, dressed LE: tr edema, NT  CBC    Component Value Date/Time   WBC 13.1 (H) 03/04/2018 0541   RBC 3.47 (L) 03/04/2018 0541   HGB 11.3 (L) 03/04/2018 0541   HGB 13.8 04/18/2013 1106   HCT 33.2 (L) 03/04/2018 0541   HCT 40.0 04/18/2013 1106   PLT 219 03/04/2018 0541   PLT 291 04/18/2013 1106   MCV 95.7 03/04/2018 0541   MCV 87.2 05/15/2015 1350   MCV 91 04/18/2013 1106   MCH 32.6 03/04/2018 0541   MCHC 34.0 03/04/2018 0541   RDW 13.4 03/04/2018 0541   RDW 12.4 04/18/2013 1106   LYMPHSABS 1,740 07/18/2016 0949   LYMPHSABS 1.6 04/18/2013 1106   MONOABS 420 07/18/2016 0949   MONOABS 0.6 04/18/2013 1106   EOSABS 120 07/18/2016 0949   EOSABS 0.1 04/18/2013 1106   BASOSABS 60 07/18/2016 0949   BASOSABS 0.1 04/18/2013 1106    A/P: POD#  2 s/p PCS - post-op. Doing well.   Alyssa Kaiser 03/05/2018 8:24 AM

## 2018-03-05 NOTE — Lactation Note (Addendum)
This note was copied from a baby's chart. Lactation Consultation Note  Patient Name: Alyssa Kaiser JFHLK'T Date: 03/05/2018 Reason for consult: Initial assessment;1st time breastfeeding P1, 66 hr female infant Per parents,  mom has been breast and bottle feeding infant. Mom concern about milk supply.  Canon asked mom if she knew how to hand express and she stated not really. Mom easily expressed 5 ml on spoon and EBM was giving to infant. Infant became more alert and started cuing after receiving breast milk. Mom latched infant on right breast in cross cradle hold, infant mouth was wide, tongue and chin down. LC and parents heard audible swallowing by infant. Infant BF for 20 minutes. Parents were very pleased with feeding and mom feeling more confident with BF abilities now. LC discussed I&O. Reviewed Baby & Me book's Breastfeeding Basics.  Mom made aware of O/P services, breastfeeding support groups, community resources, and our phone # for post-discharge questions.   Maternal Data Formula Feeding for Exclusion: No Has patient been taught Hand Expression?: Yes Does the patient have breastfeeding experience prior to this delivery?: No  Feeding Feeding Type: Breast Fed  LATCH Score Latch: Grasps breast easily, tongue down, lips flanged, rhythmical sucking.  Audible Swallowing: Spontaneous and intermittent  Type of Nipple: Everted at rest and after stimulation  Comfort (Breast/Nipple): Soft / non-tender  Hold (Positioning): Assistance needed to correctly position infant at breast and maintain latch.  LATCH Score: 9  Interventions Interventions: Breast feeding basics reviewed;Support pillows;Assisted with latch;Position options;Skin to skin;Expressed milk;Breast massage;Hand express;Adjust position;Breast compression  Lactation Tools Discussed/Used WIC Program: No   Consult Status Consult Status: Follow-up Date: 03/05/18 Follow-up type: In-patient    Vicente Serene 03/05/2018, 6:28 AM

## 2018-03-06 ENCOUNTER — Encounter (HOSPITAL_COMMUNITY): Payer: Self-pay | Admitting: *Deleted

## 2018-03-06 MED ORDER — ACETAMINOPHEN 325 MG PO TABS: 650.0000 mg | ORAL_TABLET | ORAL | Status: AC | PRN

## 2018-03-06 MED ORDER — IBUPROFEN 600 MG PO TABS: 600.0000 mg | ORAL_TABLET | Freq: Four times a day (QID) | ORAL | 0 refills | Status: AC

## 2018-03-06 MED ORDER — OXYCODONE-ACETAMINOPHEN 5-325 MG PO TABS: 1.0000 | ORAL_TABLET | ORAL | 0 refills | Status: DC | PRN

## 2018-03-06 MED ORDER — SENNOSIDES-DOCUSATE SODIUM 8.6-50 MG PO TABS: 2.0000 | ORAL_TABLET | ORAL | Status: DC

## 2018-03-06 MED ORDER — COCONUT OIL OIL: 1.0000 "application " | TOPICAL_OIL | 0 refills | Status: AC | PRN

## 2018-03-06 MED ORDER — SIMETHICONE 80 MG PO CHEW: 80.0000 mg | CHEWABLE_TABLET | ORAL | 0 refills | Status: DC

## 2018-03-06 NOTE — Discharge Summary (Signed)
OB Discharge Summary  Patient Name: Alyssa Kaiser DOB: [birth year] MRN: [id number]  Date of admission: 03/03/2018 Delivering provider: MODY, VAISHALI   Date of discharge: 03/06/2018  Admitting diagnosis: Breech Presentation Intrauterine pregnancy: [redacted]w[redacted]d     Secondary diagnosis:Principal Problem:   1C/S - BREECH 10/2 Active Problems:   Breech, frank   Postpartum care following cesarean delivery (10/2)  Additional problems:none     Discharge diagnosis:  Patient Active Problem List   Diagnosis Date Noted  . Breech, frank 03/03/2018  . 1C/S - BREECH 10/2 03/03/2018  . Postpartum care following cesarean delivery (10/2) 03/03/2018  . Bilateral ovarian cysts 09/29/2016  . Intramural leiomyoma of uterus 09/29/2016  . Late menses 11/07/2014                                            Post partum procedures: none  Pain control: Spinal   Complications: None  Hospital course:  Sceduled C/S   32 y.o. yo G1P0000 at [redacted]w[redacted]d was admitted to the hospital 03/03/2018 for scheduled cesarean section with the following indication:Malpresentation.  Membrane Rupture Time/Date: 8:36 AM ,03/03/2018   Patient delivered a Viable infant.03/03/2018  Details of operation can be found in separate operative note.  Pateint had an uncomplicated postpartum course.  She is ambulating, tolerating a regular diet, passing flatus, and urinating well. Patient is discharged home in stable condition on  03/06/18         Physical exam  Vitals:   03/04/18 2226 03/05/18 0625 03/05/18 1453 03/05/18 2311  BP: 114/69 128/62 113/71 112/62  Pulse: 65 72 70 72  Resp: 18 18 16    Temp: 98.2 F (36.8 C) 98.2 F (36.8 C) 97.9 F (36.6 C)   TempSrc: Oral  Oral   SpO2:  100% 98%   Weight:      Height:       General: alert, cooperative and no distress Lochia: appropriate Uterine Fundus: firm Incision: No significant erythema, Dressing is clean, dry, and intact DVT Evaluation: No cords or calf tenderness. No significant  calf/ankle edema. Labs: Lab Results  Component Value Date   WBC 13.1 (H) 03/04/2018   HGB 11.3 (L) 03/04/2018   HCT 33.2 (L) 03/04/2018   MCV 95.7 03/04/2018   PLT 219 03/04/2018   CMP Latest Ref Rng & Units 06/02/2017  Glucose 65 - 99 mg/dL 109(H)  BUN 6 - 20 mg/dL 21(H)  Creatinine 0.44 - 1.00 mg/dL 0.78  Sodium 135 - 145 mmol/L 135  Potassium 3.5 - 5.1 mmol/L 3.7  Chloride 101 - 111 mmol/L 103  CO2 22 - 32 mmol/L 23  Calcium 8.9 - 10.3 mg/dL 9.4  Total Protein 6.5 - 8.1 g/dL 8.3(H)  Total Bilirubin 0.3 - 1.2 mg/dL 0.9  Alkaline Phos 38 - 126 U/L 43  AST 15 - 41 U/L 24  ALT 14 - 54 U/L 17    Discharge instruction: per After Visit Summary and "Baby and Me Booklet".  After Visit Meds:  Allergies as of 03/06/2018      Reactions   Other    Pt has a stomach ulcer and is unable to take certain medication(s) but is unable to recall name of medication      Medication List    TAKE these medications   acetaminophen 325 MG tablet Commonly known as:  TYLENOL Take 2 tablets (650 mg total) by mouth every 4 (four)  hours as needed (for pain scale < 4).   coconut oil Oil Apply 1 application topically as needed.   ibuprofen 600 MG tablet Commonly known as:  ADVIL,MOTRIN Take 1 tablet (600 mg total) by mouth every 6 (six) hours.   oxyCODONE-acetaminophen 5-325 MG tablet Commonly known as:  PERCOCET/ROXICET Take 1 tablet by mouth every 4 (four) hours as needed (pain scale 4-7).   prenatal multivitamin Tabs tablet Take 1 tablet by mouth daily at 12 noon.   senna-docusate 8.6-50 MG tablet Commonly known as:  Senokot-S Take 2 tablets by mouth daily. Start taking on:  03/07/2018   simethicone 80 MG chewable tablet Commonly known as:  MYLICON Chew 1 tablet (80 mg total) by mouth daily. Start taking on:  03/07/2018       Diet: routine diet  Activity: Advance as tolerated. Pelvic rest for 6 weeks.   Postpartum contraception: Not Discussed  Newborn Data: Live born female   Birth Weight: 7 lb 1.9 oz (3230 g) APGAR: 68, 9  Newborn Delivery   Birth date/time:  03/03/2018 08:37:00 Delivery type:  C-Section, Low Transverse C-section categorization:  Primary     named Eiliyah Baby Feeding: Breast Disposition:home with mother   Delivery Report:  Review the Delivery Report for details.    Follow up: Follow-up Information    Azucena Fallen, MD. Schedule an appointment as soon as possible for a visit in 6 week(s).   Specialty:  Obstetrics and Gynecology Contact information: [place] [address] [phone]             Signed: Otilio Carpen, MSN 03/06/2018, 11:28 AM

## 2018-03-06 NOTE — Lactation Note (Signed)
This note was copied from a baby's chart. Lactation Consultation Note  Patient Name: Alyssa Kaiser TELMR'A Date: 03/06/2018 Reason for consult: Follow-up assessment Mom states baby is having some difficulty with latching so parents are giving formula by bottle.  Breasts are full this morning but not engorged.  Discussed milk coming to volume and the prevention and treatment of engorgement.  Mom has a breast pump for home use.  Baby awake and cueing.  Positioned baby skin to skin in football hold.  Baby latched easily and fed for 15 minutes.  Good swallows and breast softening observed.  Burped baby and assisted with latching to the opposite breast using football hold.  Baby again latched with ease and fed actively.  Mom shown breast massage/compression during feeding.  Answered questions.  Lactation outpatient services and support reviewed and encouraged prn.  Maternal Data    Feeding Feeding Type: Breast Fed Nipple Type: Slow - flow  LATCH Score Latch: Grasps breast easily, tongue down, lips flanged, rhythmical sucking.  Audible Swallowing: Spontaneous and intermittent  Type of Nipple: Everted at rest and after stimulation  Comfort (Breast/Nipple): Soft / non-tender  Hold (Positioning): Assistance needed to correctly position infant at breast and maintain latch.  LATCH Score: 9  Interventions Interventions: Assisted with latch;Breast compression;Skin to skin;Adjust position;Breast massage;Support pillows  Lactation Tools Discussed/Used     Consult Status Consult Status: Complete Follow-up type: Call as needed    Ave Filter 03/06/2018, 10:51 AM

## 2018-03-06 NOTE — Progress Notes (Signed)
Patient ID: Alyssa Kaiser, female   DOB: [birth year], 32 y.o.   MRN: [id number] Subjective: POD# 3 Live born female  Birth Weight: 7 lb 1.9 oz (3230 g) APGAR: 8, 9  Newborn Delivery   Birth date/time:  03/03/2018 08:37:00 Delivery type:  C-Section, Low Transverse C-section categorization:  Primary     Delivering provider: MODY, VAISHALI   Baby name: Patrick [place] Feeding: breast  Pain control at delivery: Spinal   Reports feeling sore, breasts are swollen. Feeding: breast Patient reports tolerating PO.  Breast symptoms: edema Pain controlled with NSAID and Percocet Denies HA/SOB/C/P/N/V/dizziness. Flatus present. She reports vaginal bleeding as normal, without clots.  She is ambulating, urinating without difficulty.     Objective:   VS:    Vitals:   03/04/18 2226 03/05/18 0625 03/05/18 1453 03/05/18 2311  BP: 114/69 128/62 113/71 112/62  Pulse: 65 72 70 72  Resp: 18 18 16    Temp: 98.2 F (36.8 C) 98.2 F (36.8 C) 97.9 F (36.6 C)   TempSrc: Oral  Oral   SpO2:  100% 98%   Weight:      Height:        No intake or output data in the 24 hours ending 03/06/18 1115      Recent Labs    03/04/18 0541  WBC 13.1*  HGB 11.3*  HCT 33.2*  PLT 219     Blood type: --/--/A POS, A POS Performed at [facility], [address], [place], [place] [address]  (10/01 1110)  Rubella: Immune (03/11 0000)     Physical Exam:  General: alert, cooperative and no distress Abdomen: soft, nontender, normal bowel sounds Incision: clean, dry and intact Uterine Fundus: firm, below umbilicus, nontender Lochia: minimal Ext: no edema, redness or tenderness in the calves or thighs      Assessment/Plan: 32 y.o.   POD# 3. G1P0000                  Principal Problem:   1C/S - BREECH 10/2 Active Problems:   Breech, frank   Postpartum care following cesarean delivery (10/2)   Doing well, stable.    Routine post-op care Lactation consult prior to [place] home today w/  instructions  F/U at [facility] in 6 weeks and PRN   Juliene Pina, CNM, MSN 03/06/2018, 11:15 AM

## 2018-06-16 ENCOUNTER — Encounter: Payer: 59 | Admitting: Family Medicine

## 2018-06-16 ENCOUNTER — Encounter

## 2018-06-28 ENCOUNTER — Other Ambulatory Visit: Payer: Self-pay

## 2018-06-28 ENCOUNTER — Ambulatory Visit: Payer: Self-pay | Admitting: Family Medicine

## 2018-06-28 ENCOUNTER — Encounter: Payer: Self-pay | Admitting: Family Medicine

## 2018-06-28 VITALS — BP 99/65 | HR 63 | Temp 97.7°F | Ht 66.0 in | Wt 138.6 lb

## 2018-06-28 DIAGNOSIS — L2082 Flexural eczema: Secondary | ICD-10-CM

## 2018-06-28 DIAGNOSIS — R21 Rash and other nonspecific skin eruption: Secondary | ICD-10-CM

## 2018-06-28 DIAGNOSIS — N912 Amenorrhea, unspecified: Secondary | ICD-10-CM

## 2018-06-28 LAB — POCT URINE PREGNANCY: Preg Test, Ur: NEGATIVE

## 2018-06-28 LAB — POCT SKIN KOH: Skin KOH, POC: NEGATIVE

## 2018-06-28 MED ORDER — TRIAMCINOLONE ACETONIDE 0.1 % EX CREA: 1.0000 "application " | TOPICAL_CREAM | Freq: Two times a day (BID) | CUTANEOUS | 2 refills | Status: AC

## 2018-06-28 NOTE — Patient Instructions (Addendum)
   [email or web address]   If you have lab work done today you will be contacted with your lab results within the next 2 weeks.  If you have not heard from [place] then please contact us. The fastest way to get your results is to register for My Chart.   IF you received an x-ray today, you will receive an invoice from [facility]. Please contact [facility] at [phone] with questions or concerns regarding your invoice.   IF you received labwork today, you will receive an invoice from [facility]. Please contact [facility] at [phone] with questions or concerns regarding your invoice.   Our billing staff will not be able to assist you with questions regarding bills from these companies.  You will be contacted with the lab results as soon as they are available. The fastest way to get your results is to activate your My Chart account. Instructions are located on the last page of this paperwork. If you have not heard from [place] regarding the results in 2 weeks, please contact this office.

## 2018-06-28 NOTE — Progress Notes (Signed)
1/27/20202:49 PM  Alyssa Kaiser 10-Apr-1986, 33 y.o. female [id number]  Chief Complaint  Patient presents with  . Rash    on back of legs, cheast and inner thighs. Onset during pregnancy, really itchy.    HPI:   Patient is a 33 y.o. female who presents today for rash  Has had recurring rash Very dry and itchy, no growing Right breast, left posterior knee and left groin In the past treated with triamcinolone, helped but then rash returned Breast feeding, not exclusively Not on BC Has not had period since birth of her child oct 2nd  Fall Risk  06/28/2018 12/29/2016 06/12/2016 11/05/2015  Falls in the past year? 0 No No No     Depression screen [facility] 2/9 06/28/2018 12/29/2016 10/13/2016  Decreased Interest 0 0 0  Down, Depressed, Hopeless 0 0 0  PHQ - 2 Score 0 0 0    Allergies  Allergen Reactions  . Other     Pt has a stomach ulcer and is unable to take certain medication(s) but is unable to recall name of medication    Prior to Admission medications   Medication Sig Start Date End Date Taking? Authorizing Provider  acetaminophen (TYLENOL) 325 MG tablet Take 2 tablets (650 mg total) by mouth every 4 (four) hours as needed (for pain scale < 4). 03/06/18  Yes Derrell Lolling C, CNM  coconut oil OIL Apply 1 application topically as needed. 03/06/18  Yes Juliene Pina, CNM  ibuprofen (ADVIL,MOTRIN) 600 MG tablet Take 1 tablet (600 mg total) by mouth every 6 (six) hours. 03/06/18  Yes Juliene Pina, CNM  Prenatal Vit-Fe Fumarate-FA (PRENATAL MULTIVITAMIN) TABS tablet Take 1 tablet by mouth daily at 12 noon.   Yes [provider]    Past Medical History:  Diagnosis Date  . Depression     Past Surgical History:  Procedure Laterality Date  . APPENDECTOMY    . CESAREAN SECTION N/A 03/03/2018   Procedure: Primary CESAREAN SECTION;  Surgeon: Azucena Fallen, MD;  Location: [facility];  Service: Obstetrics;  Laterality: N/A;  EDD: 03/10/18    Social History   Tobacco  Use  . Smoking status: Never Smoker  . Smokeless tobacco: Never Used  Substance Use Topics  . Alcohol use: No    Alcohol/week: 0.0 standard drinks    Family History  Problem Relation Age of Onset  . Thyroid disease Mother   . Hypertension Mother     ROS Per hpi  OBJECTIVE:  Blood pressure 99/65, pulse 63, temperature 97.7 F (36.5 C), height 5\' 6"  (1.676 m), weight 138 lb 9.6 oz (62.9 kg), SpO2 96 %, unknown if currently breastfeeding. Body mass index is 22.37 kg/m.   Physical Exam Vitals signs and nursing note reviewed.  Constitutional:      Appearance: She is well-developed.  HENT:     Head: Normocephalic and atraumatic.  Eyes:     General: No scleral icterus.    Conjunctiva/sclera: Conjunctivae normal.     Pupils: Pupils are equal, round, and reactive to light.  Neck:     Musculoskeletal: Neck supple.  Pulmonary:     Effort: Pulmonary effort is normal.  Skin:    General: Skin is warm and dry.     Findings: Rash (hyperpigemented slightly scaly patches,) present.  Neurological:     Mental Status: She is alert and oriented to person, place, and time.       Results for orders placed or performed in visit on 06/28/18 (  from the past 24 hour(s))  POCT Skin KOH     Status: None   Collection Time: 06/28/18  3:05 PM  Result Value Ref Range   Skin KOH, POC Negative Negative  POCT urine pregnancy     Status: None   Collection Time: 06/28/18  3:54 PM  Result Value Ref Range   Preg Test, Ur Negative Negative    ASSESSMENT and PLAN  1. Flexural eczema Discussed supportive measures, new meds r/se/b and RTC precautions. Patient educational website info given  2. Rash and nonspecific skin eruption - POCT Skin KOH  3. Amenorrhea Discussed lactation provides BC only if being done exclusively. Discussed BC options. Patient to consider - POCT urine pregnancy  Other orders - triamcinolone cream (KENALOG) 0.1 %; Apply 1 application topically 2 (two) times  daily.  Return if symptoms worsen or fail to improve.    Rutherford Guys, MD Primary [facility] at [facility] [place], [place] [address] Ph.  [phone] Fax [phone]

## 2018-08-11 ENCOUNTER — Other Ambulatory Visit: Payer: Self-pay

## 2018-08-11 ENCOUNTER — Emergency Department (HOSPITAL_COMMUNITY)
Admission: EM | Admit: 2018-08-11 | Discharge: 2018-08-12 | Disposition: A | Discharge status: Home / Self Care | Payer: 59 | Attending: Emergency Medicine | Admitting: Emergency Medicine

## 2018-08-11 ENCOUNTER — Encounter (HOSPITAL_COMMUNITY): Payer: Self-pay

## 2018-08-11 DIAGNOSIS — K2900 Acute gastritis without bleeding: Secondary | ICD-10-CM | POA: Insufficient documentation

## 2018-08-11 HISTORY — DX: Gastric ulcer, unspecified as acute or chronic, without hemorrhage or perforation: K25.9

## 2018-08-11 LAB — CBC
HEMATOCRIT: 41.3 % (ref 36.0–46.0)
Hemoglobin: 13.6 g/dL (ref 12.0–15.0)
MCH: 30.5 pg (ref 26.0–34.0)
MCHC: 32.9 g/dL (ref 30.0–36.0)
MCV: 92.6 fL (ref 80.0–100.0)
NRBC: 0 % (ref 0.0–0.2)
Platelets: 328 10*3/uL (ref 150–400)
RBC: 4.46 MIL/uL (ref 3.87–5.11)
RDW: 12 % (ref 11.5–15.5)
WBC: 6.8 10*3/uL (ref 4.0–10.5)

## 2018-08-11 LAB — COMPREHENSIVE METABOLIC PANEL
ALBUMIN: 4.4 g/dL (ref 3.5–5.0)
ALK PHOS: 71 U/L (ref 38–126)
ALT: 18 U/L (ref 0–44)
ANION GAP: 9 (ref 5–15)
AST: 22 U/L (ref 15–41)
BUN: 10 mg/dL (ref 6–20)
CALCIUM: 9.5 mg/dL (ref 8.9–10.3)
CHLORIDE: 105 mmol/L (ref 98–111)
CO2: 27 mmol/L (ref 22–32)
Creatinine, Ser: 0.69 mg/dL (ref 0.44–1.00)
GFR calc Af Amer: >60 mL/min (ref >60)
GFR calc non Af Amer: >60 mL/min (ref >60)
GLUCOSE: 99 mg/dL (ref 70–99)
Potassium: 4 mmol/L (ref 3.5–5.1)
SODIUM: 141 mmol/L (ref 135–145)
Total Bilirubin: 0.5 mg/dL (ref 0.3–1.2)
Total Protein: 8.2 g/dL — ABNORMAL HIGH (ref 6.5–8.1)

## 2018-08-11 LAB — I-STAT BETA HCG BLOOD, ED (MC, WL, AP ONLY): I-stat hCG, quantitative: <5 m[IU]/mL (ref <5)

## 2018-08-11 LAB — URINALYSIS, ROUTINE W REFLEX MICROSCOPIC
Bilirubin Urine: NEGATIVE
GLUCOSE, UA: NEGATIVE mg/dL
HGB URINE DIPSTICK: NEGATIVE
Ketones, ur: NEGATIVE mg/dL
LEUKOCYTE UA: NEGATIVE
Nitrite: NEGATIVE
PH: 7 (ref 5.0–8.0)
Protein, ur: NEGATIVE mg/dL
SPECIFIC GRAVITY, URINE: 1.001 — AB (ref 1.005–1.030)

## 2018-08-11 LAB — LIPASE, BLOOD: LIPASE: 37 U/L (ref 11–51)

## 2018-08-11 NOTE — ED Triage Notes (Addendum)
Pt coming from home after eating spicy food yesterday that made her have N/V. Was seen at [facility] for symptoms and told to come here for evaluation because they don't do labs there and to be evaluated for feeling weak to the point she cannot talk or walk. Hx of stomach ulcer. Currently not having any abdominal pain.   Husband gave antinausea medication which made pt feel better. Pt also had a kid 5 months ago.

## 2018-08-12 MED ORDER — ONDANSETRON 4 MG PO TBDP: 4.0000 mg | ORAL_TABLET | Freq: Three times a day (TID) | ORAL | 0 refills | Status: AC | PRN

## 2018-08-12 MED ORDER — PANTOPRAZOLE SODIUM 20 MG PO TBEC: 20.0000 mg | DELAYED_RELEASE_TABLET | Freq: Every day | ORAL | 0 refills | Status: AC

## 2018-08-12 NOTE — ED Provider Notes (Signed)
[facility] DEPT Provider Note   CSN: [id number] Arrival date & time: 08/11/18  2144    History   Chief Complaint Chief Complaint  Patient presents with  . Abdominal Pain  . Dizziness    HPI Alyssa Kaiser is a 33 y.o. female.     HPI well appearing 33 yo female here with abd pain. Pt reports that over the past week, she has been eating significantly more spicy foods, milk, and tomato sauce. She has subsequently developed worsening aching, gnawing epigastric pain that occasionally radiates toward her back. Over the past 24 hours, this has worsened and she has also had some nbnb emesis. No diarrhea. No melena or hematochezia. She does have a h/o stomach ulcer, does not take meds for it. No RUQ pain, fever, chills. No bilious emesis. She denies sick contacts. She took an OTC dramamine yesterday, which helped her nausea but caused her to feel dizzy and lightheaded, and she slept all day. She subsequently presents for evaluation. No alleviating factors.  Past Medical History:  Diagnosis Date  . Depression   . Stomach ulcer     Patient Active Problem List   Diagnosis Date Noted  . Breech, frank 03/03/2018  . 1C/S - BREECH 10/2 03/03/2018  . Postpartum care following cesarean delivery (10/2) 03/03/2018  . Bilateral ovarian cysts 09/29/2016  . Intramural leiomyoma of uterus 09/29/2016  . Late menses 11/07/2014  . Ovarian cyst 07/02/2011  . History of food poisoning 07/02/2011    Past Surgical History:  Procedure Laterality Date  . APPENDECTOMY    . CESAREAN SECTION N/A 03/03/2018   Procedure: Primary CESAREAN SECTION;  Surgeon: Azucena Fallen, MD;  Location: [facility];  Service: Obstetrics;  Laterality: N/A;  EDD: 03/10/18     OB History    Gravida  1   Para  0   Term  0   Preterm  0   AB  0   Living  0     SAB  0   TAB  0   Ectopic  0   Multiple  0   Live Births  0            Home Medications    Prior to  Admission medications   Medication Sig Start Date End Date Taking? Authorizing Provider  acetaminophen (TYLENOL) 325 MG tablet Take 2 tablets (650 mg total) by mouth every 4 (four) hours as needed (for pain scale < 4). 03/06/18   Juliene Pina, CNM  coconut oil OIL Apply 1 application topically as needed. 03/06/18   Juliene Pina, CNM  ibuprofen (ADVIL,MOTRIN) 600 MG tablet Take 1 tablet (600 mg total) by mouth every 6 (six) hours. 03/06/18   Juliene Pina, CNM  ondansetron (ZOFRAN ODT) 4 MG disintegrating tablet Take 1 tablet (4 mg total) by mouth every 8 (eight) hours as needed for nausea or vomiting. 08/12/18   Duffy Bruce, MD  pantoprazole (PROTONIX) 20 MG tablet Take 1 tablet (20 mg total) by mouth daily for 14 days. 08/12/18 08/26/18  Duffy Bruce, MD  Prenatal Vit-Fe Fumarate-FA (PRENATAL MULTIVITAMIN) TABS tablet Take 1 tablet by mouth daily at 12 noon.    [provider]  triamcinolone cream (KENALOG) 0.1 % Apply 1 application topically 2 (two) times daily. 06/28/18   Rutherford Guys, MD    Family History Family History  Problem Relation Age of Onset  . Thyroid disease Mother   . Hypertension Mother     Social  History Social History   Tobacco Use  . Smoking status: Never Smoker  . Smokeless tobacco: Never Used  Substance Use Topics  . Alcohol use: No    Alcohol/week: 0.0 standard drinks  . Drug use: No     Allergies   Other   Review of Systems Review of Systems  Constitutional: Positive for fatigue. Negative for chills and fever.  HENT: Negative for congestion and rhinorrhea.   Eyes: Negative for visual disturbance.  Respiratory: Negative for cough, shortness of breath and wheezing.   Cardiovascular: Negative for chest pain and leg swelling.  Gastrointestinal: Positive for abdominal pain, nausea and vomiting. Negative for diarrhea.  Genitourinary: Negative for dysuria and flank pain.  Musculoskeletal: Negative for neck pain and neck stiffness.   Skin: Negative for rash and wound.  Allergic/Immunologic: Negative for immunocompromised state.  Neurological: Positive for dizziness (after taking dramamine) and weakness. Negative for syncope and headaches.  All other systems reviewed and are negative.    Physical Exam Updated Vital Signs BP 135/81 (BP Location: Right Arm)   Pulse 68   Temp 97.6 F (36.4 C) (Oral)   Resp 18   Ht 5\' 6"  (1.676 m)   Wt 63 kg   SpO2 100%   BMI 22.44 kg/m   Physical Exam Vitals signs and nursing note reviewed.  Constitutional:      General: She is not in acute distress.    Appearance: She is well-developed.  HENT:     Head: Normocephalic and atraumatic.  Eyes:     Conjunctiva/sclera: Conjunctivae normal.  Neck:     Musculoskeletal: Neck supple.  Cardiovascular:     Rate and Rhythm: Normal rate and regular rhythm.     Heart sounds: Normal heart sounds. No murmur. No friction rub.  Pulmonary:     Effort: Pulmonary effort is normal. No respiratory distress.     Breath sounds: Normal breath sounds. No wheezing or rales.  Abdominal:     General: There is no distension.     Palpations: Abdomen is soft.     Tenderness: Tenderness: minimal, no rebound or guarding.  Skin:    General: Skin is warm.     Capillary Refill: Capillary refill takes less than 2 seconds.  Neurological:     Mental Status: She is alert and oriented to person, place, and time.     Motor: No abnormal muscle tone.      ED Treatments / Results  Labs (all labs ordered are listed, but only abnormal results are displayed) Labs Reviewed  COMPREHENSIVE METABOLIC PANEL - Abnormal; Notable for the following components:      Result Value   Total Protein 8.2 (*)    All other components within normal limits  URINALYSIS, ROUTINE W REFLEX MICROSCOPIC - Abnormal; Notable for the following components:   Color, Urine COLORLESS (*)    Specific Gravity, Urine 1.001 (*)    All other components within normal limits  LIPASE, BLOOD   CBC  I-STAT BETA HCG BLOOD, ED ([facility], [facility], [facility] ONLY)    EKG None  Radiology No results found.  Procedures Procedures (including critical care time)  Medications Ordered in ED Medications - No data to display   Initial Impression / Assessment and Plan / ED Course  I have reviewed the triage vital signs and the nursing notes.  Pertinent labs & imaging results that were available during my care of the patient were reviewed by me and considered in my medical decision making (see chart for details).  33 yo F here with mild epigastric discomfort, nausea, vomiting (NBNB). On arrival, VSS and WNL. She has felt better since arriving to waiting room (eval delayed 2/2 long wait times). On my exam, she has minimal epigastric TTP but no focal TTP, neg Murphy's, no r/r/g. I suspect acute on chronic gastritis/GERD, with possible PUD. Exam, labs are reassuring. No signs of cholecystitis. Lipase wnl. No RLQ TTP or s/s appendicitis. She has a h/o PUD w/ similar sx. Will treate empirically, refer for outpt GI follow-up. Return precautions given.  Final Clinical Impressions(s) / ED Diagnoses   Final diagnoses:  Acute superficial gastritis without hemorrhage    ED Discharge Orders         Ordered    ondansetron (ZOFRAN ODT) 4 MG disintegrating tablet  Every 8 hours PRN     08/12/18 0454    pantoprazole (PROTONIX) 20 MG tablet  Daily     08/12/18 0454           Duffy Bruce, MD 08/12/18 1030

## 2018-12-01 ENCOUNTER — Other Ambulatory Visit: Payer: Self-pay | Admitting: *Deleted

## 2018-12-01 DIAGNOSIS — Z20822 Contact with and (suspected) exposure to covid-19: Secondary | ICD-10-CM

## 2018-12-06 LAB — NOVEL CORONAVIRUS, NAA: SARS-CoV-2, NAA: NOT DETECTED
